# Patient Record
Sex: Female | Born: 1935 | Race: White | Hispanic: No | Smoking: Never smoker
Health system: Southern US, Community
[De-identification: ages and names within clinical notes are randomized; demographics above are authoritative.]

## PROBLEM LIST (undated history)

## (undated) DIAGNOSIS — T7840XA Allergy, unspecified, initial encounter: Secondary | ICD-10-CM

## (undated) DIAGNOSIS — I10 Essential (primary) hypertension: Secondary | ICD-10-CM

## (undated) DIAGNOSIS — E785 Hyperlipidemia, unspecified: Secondary | ICD-10-CM

## (undated) DIAGNOSIS — S62101A Fracture of unspecified carpal bone, right wrist, initial encounter for closed fracture: Secondary | ICD-10-CM

## (undated) DIAGNOSIS — F329 Major depressive disorder, single episode, unspecified: Secondary | ICD-10-CM

## (undated) DIAGNOSIS — I4891 Unspecified atrial fibrillation: Secondary | ICD-10-CM

## (undated) DIAGNOSIS — C801 Malignant (primary) neoplasm, unspecified: Secondary | ICD-10-CM

## (undated) DIAGNOSIS — K5792 Diverticulitis of intestine, part unspecified, without perforation or abscess without bleeding: Secondary | ICD-10-CM

## (undated) DIAGNOSIS — F32A Depression, unspecified: Secondary | ICD-10-CM

## (undated) DIAGNOSIS — N879 Dysplasia of cervix uteri, unspecified: Secondary | ICD-10-CM

## (undated) DIAGNOSIS — I872 Venous insufficiency (chronic) (peripheral): Secondary | ICD-10-CM

## (undated) HISTORY — DX: Essential (primary) hypertension: I10

## (undated) HISTORY — DX: Major depressive disorder, single episode, unspecified: F32.9

## (undated) HISTORY — DX: Malignant (primary) neoplasm, unspecified: C80.1

## (undated) HISTORY — DX: Hyperlipidemia, unspecified: E78.5

## (undated) HISTORY — PX: OTHER SURGICAL HISTORY: SHX169

## (undated) HISTORY — DX: Fracture of unspecified carpal bone, right wrist, initial encounter for closed fracture: S62.101A

## (undated) HISTORY — DX: Allergy, unspecified, initial encounter: T78.40XA

## (undated) HISTORY — DX: Dysplasia of cervix uteri, unspecified: N87.9

## (undated) HISTORY — DX: Unspecified atrial fibrillation: I48.91

## (undated) HISTORY — DX: Venous insufficiency (chronic) (peripheral): I87.2

## (undated) HISTORY — DX: Diverticulitis of intestine, part unspecified, without perforation or abscess without bleeding: K57.92

## (undated) HISTORY — DX: Depression, unspecified: F32.A

---

## 1998-08-24 LAB — HM COLONOSCOPY: HM Colonoscopy: NORMAL

## 2007-06-17 ENCOUNTER — Emergency Department: Payer: Self-pay | Admitting: Emergency Medicine

## 2007-06-18 ENCOUNTER — Ambulatory Visit: Payer: Self-pay | Admitting: General Practice

## 2008-01-03 ENCOUNTER — Ambulatory Visit: Payer: Self-pay | Admitting: Internal Medicine

## 2008-05-01 ENCOUNTER — Ambulatory Visit: Payer: Self-pay | Admitting: Internal Medicine

## 2008-06-26 ENCOUNTER — Ambulatory Visit: Payer: Self-pay | Admitting: Gastroenterology

## 2008-07-09 ENCOUNTER — Ambulatory Visit: Payer: Self-pay | Admitting: Surgery

## 2008-07-24 ENCOUNTER — Ambulatory Visit: Payer: Self-pay | Admitting: Surgery

## 2008-07-24 HISTORY — PX: COLON SURGERY: SHX602

## 2008-07-31 ENCOUNTER — Inpatient Hospital Stay: Payer: Self-pay | Admitting: Surgery

## 2008-08-24 ENCOUNTER — Ambulatory Visit: Payer: Self-pay | Admitting: Oncology

## 2008-08-31 ENCOUNTER — Ambulatory Visit: Payer: Self-pay | Admitting: Oncology

## 2008-09-24 ENCOUNTER — Ambulatory Visit: Payer: Self-pay | Admitting: Oncology

## 2008-11-27 ENCOUNTER — Emergency Department: Payer: Self-pay | Admitting: Emergency Medicine

## 2008-12-22 ENCOUNTER — Ambulatory Visit: Payer: Self-pay | Admitting: Oncology

## 2008-12-27 ENCOUNTER — Ambulatory Visit: Payer: Self-pay | Admitting: Oncology

## 2009-01-10 ENCOUNTER — Ambulatory Visit: Payer: Self-pay | Admitting: Internal Medicine

## 2009-01-22 ENCOUNTER — Ambulatory Visit: Payer: Self-pay | Admitting: Oncology

## 2009-05-17 ENCOUNTER — Other Ambulatory Visit: Payer: Self-pay | Admitting: Internal Medicine

## 2009-05-24 ENCOUNTER — Ambulatory Visit: Payer: Self-pay | Admitting: Oncology

## 2009-06-13 ENCOUNTER — Ambulatory Visit: Payer: Self-pay | Admitting: Oncology

## 2009-06-24 ENCOUNTER — Ambulatory Visit: Payer: Self-pay | Admitting: Oncology

## 2009-07-08 ENCOUNTER — Ambulatory Visit: Payer: Self-pay | Admitting: Gastroenterology

## 2009-08-24 DIAGNOSIS — C801 Malignant (primary) neoplasm, unspecified: Secondary | ICD-10-CM

## 2009-08-24 HISTORY — DX: Malignant (primary) neoplasm, unspecified: C80.1

## 2009-11-19 ENCOUNTER — Ambulatory Visit: Payer: Self-pay | Admitting: Internal Medicine

## 2009-12-22 ENCOUNTER — Ambulatory Visit: Payer: Self-pay | Admitting: Oncology

## 2009-12-23 ENCOUNTER — Ambulatory Visit: Payer: Self-pay | Admitting: Oncology

## 2010-01-22 ENCOUNTER — Ambulatory Visit: Payer: Self-pay | Admitting: Oncology

## 2010-05-13 ENCOUNTER — Ambulatory Visit: Payer: Self-pay | Admitting: Internal Medicine

## 2010-06-23 ENCOUNTER — Ambulatory Visit: Payer: Self-pay | Admitting: Oncology

## 2010-06-24 ENCOUNTER — Ambulatory Visit: Payer: Self-pay | Admitting: Oncology

## 2010-07-15 ENCOUNTER — Ambulatory Visit: Payer: Self-pay | Admitting: Gastroenterology

## 2010-07-24 ENCOUNTER — Ambulatory Visit: Payer: Self-pay | Admitting: Oncology

## 2010-10-17 ENCOUNTER — Other Ambulatory Visit: Payer: Self-pay | Admitting: Orthopedic Surgery

## 2010-10-17 DIAGNOSIS — M545 Low back pain, unspecified: Secondary | ICD-10-CM

## 2010-10-22 ENCOUNTER — Ambulatory Visit
Admission: RE | Admit: 2010-10-22 | Discharge: 2010-10-22 | Disposition: A | Discharge status: Home / Self Care | Payer: Medicare HMO | Source: Ambulatory Visit | Attending: Orthopedic Surgery | Admitting: Orthopedic Surgery

## 2010-10-22 DIAGNOSIS — M545 Low back pain, unspecified: Secondary | ICD-10-CM

## 2010-11-13 ENCOUNTER — Ambulatory Visit: Payer: Self-pay | Admitting: Pain Medicine

## 2010-11-24 ENCOUNTER — Ambulatory Visit: Payer: Self-pay | Admitting: Pain Medicine

## 2010-12-02 ENCOUNTER — Ambulatory Visit: Payer: Self-pay | Admitting: Pain Medicine

## 2010-12-15 ENCOUNTER — Ambulatory Visit: Payer: Self-pay | Admitting: Oncology

## 2010-12-16 ENCOUNTER — Ambulatory Visit: Payer: Self-pay | Admitting: Pain Medicine

## 2010-12-16 LAB — CEA: CEA: 4.8 ng/mL — ABNORMAL HIGH (ref 0.0–4.7)

## 2010-12-23 ENCOUNTER — Ambulatory Visit: Payer: Self-pay | Admitting: Oncology

## 2011-04-15 ENCOUNTER — Ambulatory Visit: Payer: Self-pay | Admitting: Internal Medicine

## 2011-04-23 ENCOUNTER — Telehealth: Payer: Self-pay | Admitting: Internal Medicine

## 2011-04-23 DIAGNOSIS — Z1239 Encounter for other screening for malignant neoplasm of breast: Secondary | ICD-10-CM

## 2011-04-24 NOTE — Telephone Encounter (Signed)
Patient called and requested her yearly mammogram be set up.  She stated her last one was last Sept. And she wants to go to [place].  Please advise

## 2011-06-03 LAB — HM MAMMOGRAPHY: HM Mammogram: NORMAL

## 2011-06-05 NOTE — Telephone Encounter (Signed)
Patient has an appt at [facility] for her mammogram on 06/12/11 patient was informed by Zella Ball on 9.4.12 per notes on order/slj

## 2011-06-12 ENCOUNTER — Ambulatory Visit: Payer: Self-pay | Admitting: Internal Medicine

## 2011-06-12 ENCOUNTER — Encounter: Payer: Self-pay | Admitting: Internal Medicine

## 2011-06-12 ENCOUNTER — Ambulatory Visit (INDEPENDENT_AMBULATORY_CARE_PROVIDER_SITE_OTHER): Payer: Medicare HMO | Admitting: Internal Medicine

## 2011-06-12 DIAGNOSIS — J45909 Unspecified asthma, uncomplicated: Secondary | ICD-10-CM

## 2011-06-12 DIAGNOSIS — J309 Allergic rhinitis, unspecified: Secondary | ICD-10-CM

## 2011-06-12 DIAGNOSIS — E785 Hyperlipidemia, unspecified: Secondary | ICD-10-CM

## 2011-06-12 DIAGNOSIS — I4891 Unspecified atrial fibrillation: Secondary | ICD-10-CM

## 2011-06-12 DIAGNOSIS — M810 Age-related osteoporosis without current pathological fracture: Secondary | ICD-10-CM

## 2011-06-12 DIAGNOSIS — I10 Essential (primary) hypertension: Secondary | ICD-10-CM

## 2011-06-12 MED ORDER — ALENDRONATE SODIUM 40 MG PO TABS: 40.0000 mg | ORAL_TABLET | ORAL | Status: DC

## 2011-06-12 MED ORDER — ALBUTEROL 90 MCG/ACT IN AERS: 2.0000 | INHALATION_SPRAY | Freq: Four times a day (QID) | RESPIRATORY_TRACT | Status: DC | PRN

## 2011-06-12 MED ORDER — LISINOPRIL 5 MG PO TABS: 5.0000 mg | ORAL_TABLET | Freq: Every day | ORAL | Status: DC

## 2011-06-12 MED ORDER — TIOTROPIUM BROMIDE MONOHYDRATE 18 MCG IN CAPS: 18.0000 ug | ORAL_CAPSULE | Freq: Every day | RESPIRATORY_TRACT | Status: AC

## 2011-06-12 NOTE — Progress Notes (Signed)
  Subjective:    Patient ID: Diamond Walsh, female    DOB: 29-Aug-1934, 76 y.o.   MRN: [id number]  HPI Increased aniety about son who is an alcoholic.  Second issue is recurrent left lower quadrant gnawing pain which is accompanied by nausea, lasts a few days , then resolves  spontaaneously.  No change in bowel or bladder habits during epsiodes,  No known fevers.  Last colonoscopy fall 2011. Tics noted   Review of Systems  Constitutional: Negative for fever, chills and unexpected weight change.  HENT: Negative for hearing loss, ear pain, nosebleeds, congestion, sore throat, facial swelling, rhinorrhea, sneezing, mouth sores, trouble swallowing, neck pain, neck stiffness, voice change, postnasal drip, sinus pressure, tinnitus and ear discharge.   Eyes: Negative for pain, discharge, redness and visual disturbance.  Respiratory: Negative for cough, chest tightness, shortness of breath, wheezing and stridor.   Cardiovascular: Negative for chest pain, palpitations and leg swelling.  Musculoskeletal: Negative for myalgias and arthralgias.  Skin: Negative for color change and rash.  Neurological: Negative for dizziness, weakness, light-headedness and headaches.  Hematological: Negative for adenopathy.   BP 138/72  Pulse 72  Temp(Src) 98.7 F (37.1 C) (Oral)  Ht 5\' 6"  (1.676 m)  Wt 168 lb (76.204 kg)  BMI 27.12 kg/m2     Objective:   Physical Exam  Constitutional: She is oriented to person, place, and time. She appears well-developed and well-nourished.  HENT:  Mouth/Throat: Oropharynx is clear and moist.  Eyes: EOM are normal. Pupils are equal, round, and reactive to light. No scleral icterus.  Neck: Normal range of motion. Neck supple. No JVD present. No thyromegaly present.  Cardiovascular: Normal rate, regular rhythm, normal heart sounds and intact distal pulses.   Pulmonary/Chest: Effort normal and breath sounds normal.  Abdominal: Soft. Bowel sounds are normal. She exhibits no mass.  There is no tenderness.  Musculoskeletal: Normal range of motion. She exhibits no edema.  Lymphadenopathy:    She has no cervical adenopathy.  Neurological: She is alert and oriented to person, place, and time.  Skin: Skin is warm and dry.  Psychiatric: She has a normal mood and affect.          Assessment & Plan:  Anxiety:  Spent 10 minutes discussing sources, including her son's trouble with alcholism.  Symptoms are currently not affecting her sleep or appetite.    s

## 2011-06-12 NOTE — Patient Instructions (Signed)
Return for fasting labs  at your leisure  We are starting 5 mg of lisinopril for goal bp of < 130/80

## 2011-06-14 ENCOUNTER — Encounter: Payer: Self-pay | Admitting: Internal Medicine

## 2011-06-14 DIAGNOSIS — F32A Depression, unspecified: Secondary | ICD-10-CM | POA: Insufficient documentation

## 2011-06-14 DIAGNOSIS — K5792 Diverticulitis of intestine, part unspecified, without perforation or abscess without bleeding: Secondary | ICD-10-CM | POA: Insufficient documentation

## 2011-06-14 DIAGNOSIS — I471 Supraventricular tachycardia, unspecified: Secondary | ICD-10-CM | POA: Insufficient documentation

## 2011-06-14 DIAGNOSIS — I10 Essential (primary) hypertension: Secondary | ICD-10-CM | POA: Insufficient documentation

## 2011-06-14 DIAGNOSIS — F329 Major depressive disorder, single episode, unspecified: Secondary | ICD-10-CM | POA: Insufficient documentation

## 2011-06-14 DIAGNOSIS — T7840XA Allergy, unspecified, initial encounter: Secondary | ICD-10-CM | POA: Insufficient documentation

## 2011-06-14 DIAGNOSIS — E785 Hyperlipidemia, unspecified: Secondary | ICD-10-CM | POA: Insufficient documentation

## 2011-06-14 NOTE — Assessment & Plan Note (Signed)
mild elevation noted today, confirmed with home readings..  Adding lisinopril

## 2011-06-14 NOTE — Assessment & Plan Note (Signed)
Managed with pravastatin . Fasting lipids due.   

## 2011-06-14 NOTE — Assessment & Plan Note (Signed)
Controlled with diltiazem.  Not taking coumadin for long term anticoagulation, which may merit revisiting in the near future given her age and concurrent hypertension.

## 2011-06-16 ENCOUNTER — Telehealth: Payer: Self-pay | Admitting: *Deleted

## 2011-06-16 DIAGNOSIS — R5381 Other malaise: Secondary | ICD-10-CM

## 2011-06-16 DIAGNOSIS — Z79899 Other long term (current) drug therapy: Secondary | ICD-10-CM

## 2011-06-16 DIAGNOSIS — E785 Hyperlipidemia, unspecified: Secondary | ICD-10-CM

## 2011-06-16 NOTE — Telephone Encounter (Signed)
Patient is coming in for labs tomorrow. I see in office visit that she was told to return for fasting labs, but I do not see an order. What labs would you like for be to draw.

## 2011-06-17 ENCOUNTER — Other Ambulatory Visit (INDEPENDENT_AMBULATORY_CARE_PROVIDER_SITE_OTHER): Payer: Medicare HMO | Admitting: *Deleted

## 2011-06-17 DIAGNOSIS — E785 Hyperlipidemia, unspecified: Secondary | ICD-10-CM

## 2011-06-17 DIAGNOSIS — R5381 Other malaise: Secondary | ICD-10-CM

## 2011-06-17 LAB — CBC WITH DIFFERENTIAL/PLATELET
Basophils Relative: 0.5 % (ref 0.0–3.0)
Eosinophils Relative: 3.4 % (ref 0.0–5.0)
HCT: 41.2 % (ref 36.0–46.0)
Hemoglobin: 13.7 g/dL (ref 12.0–15.0)
Lymphs Abs: 1.7 10*3/uL (ref 0.7–4.0)
MCV: 84.9 fl (ref 78.0–100.0)
Monocytes Absolute: 0.6 10*3/uL (ref 0.1–1.0)
Monocytes Relative: 11.7 % (ref 3.0–12.0)
RBC: 4.86 Mil/uL (ref 3.87–5.11)
WBC: 4.7 10*3/uL (ref 4.5–10.5)

## 2011-06-17 LAB — COMPREHENSIVE METABOLIC PANEL
Alkaline Phosphatase: 74 U/L (ref 39–117)
BUN: 17 mg/dL (ref 6–23)
Creatinine, Ser: 0.6 mg/dL (ref 0.4–1.2)
Glucose, Bld: 90 mg/dL (ref 70–99)
Sodium: 141 mEq/L (ref 135–145)
Total Bilirubin: 0.5 mg/dL (ref 0.3–1.2)

## 2011-06-17 LAB — LIPID PANEL
Cholesterol: 167 mg/dL (ref 0–200)
LDL Cholesterol: 78 mg/dL (ref 0–99)

## 2011-06-17 NOTE — Telephone Encounter (Signed)
entered in epic,  thanks

## 2011-06-18 ENCOUNTER — Ambulatory Visit: Payer: Self-pay | Admitting: Oncology

## 2011-06-19 ENCOUNTER — Encounter: Payer: Self-pay | Admitting: Internal Medicine

## 2011-06-19 LAB — CEA: CEA: 2.9 ng/mL (ref 0.0–4.7)

## 2011-06-22 ENCOUNTER — Telehealth: Payer: Self-pay | Admitting: Internal Medicine

## 2011-06-22 NOTE — Telephone Encounter (Signed)
[phone] Pt would like get lab results she had done last week

## 2011-06-22 NOTE — Telephone Encounter (Signed)
Notified patient I sent her a letter in the mail last week notifying her of her lab results.

## 2011-06-25 ENCOUNTER — Ambulatory Visit: Payer: Self-pay | Admitting: Oncology

## 2011-07-01 ENCOUNTER — Encounter: Payer: Self-pay | Admitting: Internal Medicine

## 2011-07-27 ENCOUNTER — Telehealth: Payer: Self-pay | Admitting: Internal Medicine

## 2011-07-27 DIAGNOSIS — R1031 Right lower quadrant pain: Secondary | ICD-10-CM

## 2011-07-27 NOTE — Telephone Encounter (Signed)
Patient notified

## 2011-07-27 NOTE — Telephone Encounter (Signed)
Patient called and stated she was told to call and let you know if she had another episode of nausea and discomfort and a CT would be scheduled she wanted to know if this could be done now.  Please advise.

## 2011-07-27 NOTE — Telephone Encounter (Signed)
Yes I will order the CT of abdome and pelvis and have placed the order EPIC for [facility] to do.

## 2011-07-30 ENCOUNTER — Telehealth: Payer: Self-pay | Admitting: Internal Medicine

## 2011-07-30 NOTE — Telephone Encounter (Signed)
Dr. Darrick Huntsman left a message on Diamond Walsh's home line per patient's request.

## 2011-07-30 NOTE — Telephone Encounter (Signed)
Patient called in requesting status on her referral.  I explained to patient that I was working on referrals and that Erie Noe was currently working on her referral yesterday.  I don't know the status of that referral so I advised patient that I would call and see about getting this pre-auth from her insurance. I called her insurance company and for cpt [id number] didn't require a pre-auth.  I called and spoke with Annabelle Harman at [facility] and she gave me an appt. For tomorrow afternoon at [facility] location patient needs to be NPO after 9 in the morning, and she needs to go and pick up a prep kit.  However when I spoke with Annabelle Harman she said that this really needs to be pelvic/abdominal.  I have called the insurance company again to find out about pre-auth.  She was very upset that it has taken me 5 days to get this done, I seen a  Phone note that was taken on the 3rd at 5:33, I tried to let the patient know that I was sorry it was taking a couple of days to get this scheduled. She would like to talk with Dr. Darrick Huntsman. Zella Ball has made sure the patient is aware of the appointment since I was still holding for insurance company. No pre-cert is required for CPT 74177.

## 2011-07-31 ENCOUNTER — Ambulatory Visit: Payer: Self-pay | Admitting: Internal Medicine

## 2011-08-03 ENCOUNTER — Encounter: Payer: Self-pay | Admitting: Internal Medicine

## 2011-08-03 ENCOUNTER — Telehealth: Payer: Self-pay | Admitting: Internal Medicine

## 2011-08-03 ENCOUNTER — Ambulatory Visit (INDEPENDENT_AMBULATORY_CARE_PROVIDER_SITE_OTHER): Payer: Medicare HMO | Admitting: Internal Medicine

## 2011-08-03 VITALS — BP 136/72 | HR 82 | Temp 98.3°F | Resp 14 | Ht 66.0 in | Wt 167.0 lb

## 2011-08-03 DIAGNOSIS — R1012 Left upper quadrant pain: Secondary | ICD-10-CM

## 2011-08-03 DIAGNOSIS — R110 Nausea: Secondary | ICD-10-CM

## 2011-08-03 LAB — POCT URINALYSIS DIPSTICK
Bilirubin, UA: NEGATIVE
Nitrite, UA: NEGATIVE
Protein, UA: NEGATIVE
Urobilinogen, UA: 0.2
pH, UA: 6

## 2011-08-03 MED ORDER — CIPROFLOXACIN HCL 250 MG PO TABS: 250.0000 mg | ORAL_TABLET | Freq: Two times a day (BID) | ORAL | Status: AC

## 2011-08-03 NOTE — Progress Notes (Signed)
  Subjective:    Patient ID: Diamond Walsh, female    DOB: Nov 01, 1934, 76 y.o.   MRN: [id number]  HPI Ms. Fuhs is a 76 yr old white female with a history of colon cancer, s/p resection and anastomosis who presents with recurrence of nausea and left sided pain for the past week.  She was seen in October and reported an earlier episode which had resolved.  The description was concerning for diverticulosis, and when the pain recurred this week she was evaluated with a CT of the abd and pelvis     Review of Systems  Constitutional: Negative for fever, chills and unexpected weight change.  HENT: Negative for hearing loss, ear pain, nosebleeds, congestion, sore throat, facial swelling, rhinorrhea, sneezing, mouth sores, trouble swallowing, neck pain, neck stiffness, voice change, postnasal drip, sinus pressure, tinnitus and ear discharge.   Eyes: Negative for pain, discharge, redness and visual disturbance.  Respiratory: Negative for cough, chest tightness, shortness of breath, wheezing and stridor.   Cardiovascular: Negative for chest pain, palpitations and leg swelling.  Gastrointestinal: Positive for nausea and abdominal pain. Negative for diarrhea, constipation and blood in stool.  Musculoskeletal: Positive for back pain. Negative for myalgias and arthralgias.  Skin: Negative for color change and rash.  Neurological: Negative for dizziness, weakness, light-headedness and headaches.  Hematological: Negative for adenopathy.       Objective:   Physical Exam  Constitutional: She is oriented to person, place, and time. She appears well-developed and well-nourished.  HENT:  Mouth/Throat: Oropharynx is clear and moist.  Eyes: EOM are normal. Pupils are equal, round, and reactive to light. No scleral icterus.  Neck: Normal range of motion. Neck supple. No JVD present. No thyromegaly present.  Cardiovascular: Normal rate, regular rhythm, normal heart sounds and intact distal pulses.     Pulmonary/Chest: Effort normal and breath sounds normal.  Abdominal: Soft. Bowel sounds are normal. She exhibits no mass. There is no tenderness. There is no rebound and no guarding.  Musculoskeletal: Normal range of motion. She exhibits no edema.  Lymphadenopathy:    She has no cervical adenopathy.  Neurological: She is alert and oriented to person, place, and time.  Skin: Skin is warm and dry.  Psychiatric: She has a normal mood and affect.          Assessment & Plan:

## 2011-08-03 NOTE — Telephone Encounter (Signed)
Patient notified of results.  She stated she is still sick on her stomach and doesn't know what else to do about it.  Please advise.

## 2011-08-03 NOTE — Telephone Encounter (Signed)
Patient has been scheduled for this afternoon, patient notified.

## 2011-08-03 NOTE — Telephone Encounter (Signed)
Message copied by Edd Fabian on Mon Aug 03, 2011  8:53 AM ------      Message from: Duncan Dull      Created: Sun Aug 02, 2011  3:05 PM      Regarding: CT Scan of abd pelvis       The CT scan that Diamond Walsh had on Friday was normal.  There were no signs of inflammation or blockage.

## 2011-08-03 NOTE — Telephone Encounter (Signed)
Please add her on to the end of the day

## 2011-08-04 DIAGNOSIS — R1012 Left upper quadrant pain: Secondary | ICD-10-CM | POA: Insufficient documentation

## 2011-08-04 NOTE — Assessment & Plan Note (Signed)
CT was negative for diverticulitis but positive for bilateral nephrolithiasis.  UA was positive for luekocytes so will treat empirically for uncomplicated pyelonephritis secondary to calculous.

## 2011-08-04 NOTE — Patient Instructions (Signed)
Your pain may be coming from an infection in your kidney caused by the kidney stone that was seen on the Ct.  I am treating your with an antibiotic for one week.

## 2011-08-05 LAB — CULTURE, URINE COMPREHENSIVE
Colony Count: NO GROWTH
Organism ID, Bacteria: NO GROWTH

## 2011-09-07 ENCOUNTER — Encounter: Payer: Self-pay | Admitting: Internal Medicine

## 2011-10-28 ENCOUNTER — Encounter: Payer: Self-pay | Admitting: Internal Medicine

## 2011-11-01 ENCOUNTER — Emergency Department: Payer: Self-pay | Admitting: Emergency Medicine

## 2011-11-01 LAB — BASIC METABOLIC PANEL
Calcium, Total: 9.5 mg/dL (ref 8.5–10.1)
EGFR (African American): >60
EGFR (Non-African Amer.): >60
Glucose: 99 mg/dL (ref 65–99)
Osmolality: 282 (ref 275–301)
Sodium: 141 mmol/L (ref 136–145)

## 2011-11-01 LAB — CBC
HCT: 41.6 % (ref 35.0–47.0)
HGB: 13.7 g/dL (ref 12.0–16.0)
MCH: 28 pg (ref 26.0–34.0)
MCHC: 32.9 g/dL (ref 32.0–36.0)
Platelet: 305 10*3/uL (ref 150–440)
RBC: 4.9 10*6/uL (ref 3.80–5.20)
WBC: 6.1 10*3/uL (ref 3.6–11.0)

## 2011-11-01 LAB — TROPONIN I: Troponin-I: <0.02 ng/mL

## 2011-11-26 ENCOUNTER — Encounter: Payer: Self-pay | Admitting: Internal Medicine

## 2011-12-02 ENCOUNTER — Ambulatory Visit (INDEPENDENT_AMBULATORY_CARE_PROVIDER_SITE_OTHER): Payer: Medicare HMO | Admitting: Internal Medicine

## 2011-12-02 ENCOUNTER — Encounter: Payer: Self-pay | Admitting: Internal Medicine

## 2011-12-02 VITALS — BP 134/62 | HR 98 | Temp 97.9°F | Resp 16 | Ht 66.0 in | Wt 166.5 lb

## 2011-12-02 DIAGNOSIS — Z0000 Encounter for general adult medical examination without abnormal findings: Secondary | ICD-10-CM

## 2011-12-02 DIAGNOSIS — I10 Essential (primary) hypertension: Secondary | ICD-10-CM

## 2011-12-02 DIAGNOSIS — E785 Hyperlipidemia, unspecified: Secondary | ICD-10-CM

## 2011-12-02 DIAGNOSIS — I872 Venous insufficiency (chronic) (peripheral): Secondary | ICD-10-CM

## 2011-12-02 MED ORDER — CITALOPRAM HYDROBROMIDE 20 MG PO TABS: 20.0000 mg | ORAL_TABLET | Freq: Every day | ORAL | Status: AC

## 2011-12-02 MED ORDER — CIPROFLOXACIN HCL 250 MG PO TABS: 250.0000 mg | ORAL_TABLET | Freq: Two times a day (BID) | ORAL | Status: AC

## 2011-12-02 MED ORDER — DILTIAZEM HCL ER COATED BEADS 120 MG PO CP24: 120.0000 mg | ORAL_CAPSULE | Freq: Every day | ORAL | Status: DC

## 2011-12-02 NOTE — Progress Notes (Signed)
Patient ID: Diamond Walsh, female   DOB: 08/09/1935, 77 y.o.   MRN: [id number]   The patient is here for annual Medicare wellness examination and management of other chronic and acute problems.  She had a recent emergency room visit for uncontrolled atrial fibrillation. Her dose of Cardizem was actually reduced to 120 mg daily and her dose of Celexa was reduced to 20 mg daily. She followed up with Dr. call scan on April 9 at which time no changes in her diltiazem dose was at work made. She had a recent echocardiogram which showed normal LV systolic function with an ejection fraction of 50%, biatrial enlargement, and moderate mitral and tricuspid regurgitation some of which is age-related.    The risk factors are reflected in the social history.  The roster of all physicians providing medical care to patient - is listed in the Snapshot section of the chart.  Activities of daily living:  The patient is 100% independent in all ADLs: dressing, toileting, feeding as well as independent mobility  Home safety : The patient has smoke detectors in the home. They wear seatbelts.  There are no firearms at home. There is no violence in the home.   There is no risks for hepatitis, STDs or HIV. There is no   history of blood transfusion. They have no travel history to infectious disease endemic areas of the world.  The patient has seen their dentist in the last six month. They have seen their eye doctor in the last year. They admit to slight hearing difficulty with regard to whispered voices and some television programs.  They have deferred audiologic testing in the last year.  They do not  have excessive sun exposure. Discussed the need for sun protection: hats, long sleeves and use of sunscreen if there is significant sun exposure.   Diet: the importance of a healthy diet is discussed. They do have a healthy diet.  The benefits of regular aerobic exercise were discussed. She walks 4 times per week ,  20  minutes.   Depression screen: there are no signs or vegative symptoms of depression- irritability, change in appetite, anhedonia, sadness/tearfullness.  Cognitive assessment: the patient manages all their financial and personal affairs and is actively engaged. They could relate day,date,year and events; recalled 2/3 objects at 3 minutes; performed clock-face test normally.  The following portions of the patient's history were reviewed and updated as appropriate: allergies, current medications, past family history, past medical history,  past surgical history, past social history  and problem list.  Visual acuity was not assessed per patient preference since she has regular follow up with her ophthalmologist. Hearing and body mass index were assessed and reviewed.   During the course of the visit the patient was educated and counseled about appropriate screening and preventive services including : fall prevention , diabetes screening, nutrition counseling, colorectal cancer screening, and recommended immunizations.   Assessment and Plan  Hypertension Well-controlled on current regimen. She is due for followup blood work to monitor kidney function and electronic.  Venous insufficiency (chronic) (peripheral) Management exercise leg elevation and stockings. She has had no trouble with worsening edema secondary to Cardizem use.  Hyperlipidemia Well-controlled by last check with elevated HDL and LDL less than 100.    Updated Medication List Outpatient Encounter Prescriptions as of 12/02/2011  Medication Sig Dispense Refill  . albuterol (PROVENTIL,VENTOLIN) 90 MCG/ACT inhaler Inhale 2 puffs into the lungs every 6 (six) hours as needed.  3 Act  3  .  aspirin 81 MG tablet Take 81 mg by mouth daily.        . citalopram (CELEXA) 20 MG tablet Take 1 tablet (20 mg total) by mouth daily.  90 tablet  3  . diltiazem (CARDIZEM CD) 120 MG 24 hr capsule Take 1 capsule (120 mg total) by mouth daily.  90  capsule  3  . lisinopril (PRINIVIL,ZESTRIL) 5 MG tablet Take 20 mg by mouth daily.      . metaproterenol (ALUPENT) 10 MG tablet Take 10 mg by mouth as needed.      . Multiple Vitamins-Minerals (PRESERVISION AREDS 2 PO) Take by mouth.        . pravastatin (PRAVACHOL) 40 MG tablet Take 40 mg by mouth daily.      Marland Kitchen tiotropium (SPIRIVA) 18 MCG inhalation capsule Place 1 capsule (18 mcg total) into inhaler and inhale daily.  90 capsule  3  . DISCONTD: citalopram (CELEXA) 20 MG tablet Take 20 mg by mouth daily.        Marland Kitchen DISCONTD: diltiazem (CARDIZEM CD) 180 MG 24 hr capsule Take 120 mg by mouth daily.       Marland Kitchen DISCONTD: lisinopril (PRINIVIL,ZESTRIL) 5 MG tablet Take 1 tablet (5 mg total) by mouth daily.  90 tablet  3  . ciprofloxacin (CIPRO) 250 MG tablet Take 1 tablet (250 mg total) by mouth 2 (two) times daily.  14 tablet  0  . DISCONTD: alendronate (FOSAMAX) 40 MG tablet Take 1 tablet (40 mg total) by mouth once a week. Take with a full glass of water on an empty stomach.  Pt unclear about mg dosage  12 tablet  3

## 2011-12-03 ENCOUNTER — Encounter: Payer: Self-pay | Admitting: Internal Medicine

## 2011-12-03 DIAGNOSIS — I872 Venous insufficiency (chronic) (peripheral): Secondary | ICD-10-CM | POA: Insufficient documentation

## 2011-12-03 LAB — COMPLETE METABOLIC PANEL WITH GFR
ALT: 16 U/L (ref 0–35)
Albumin: 4.4 g/dL (ref 3.5–5.2)
CO2: 23 mEq/L (ref 19–32)
Chloride: 102 mEq/L (ref 96–112)
GFR, Est African American: >89 mL/min
GFR, Est Non African American: >89 mL/min
Glucose, Bld: 132 mg/dL — ABNORMAL HIGH (ref 70–99)
Potassium: 4.6 mEq/L (ref 3.5–5.3)
Sodium: 135 mEq/L (ref 135–145)
Total Protein: 7.1 g/dL (ref 6.0–8.3)

## 2011-12-03 NOTE — Assessment & Plan Note (Signed)
Management exercise leg elevation and stockings. She has had no trouble with worsening edema secondary to Cardizem use.

## 2011-12-03 NOTE — Assessment & Plan Note (Signed)
Well-controlled by last check with elevated HDL and LDL less than 100.

## 2011-12-03 NOTE — Assessment & Plan Note (Signed)
Well-controlled on current regimen. She is due for followup blood work to monitor kidney function and electronic.

## 2011-12-10 ENCOUNTER — Ambulatory Visit: Payer: Self-pay | Admitting: Oncology

## 2011-12-10 LAB — COMPREHENSIVE METABOLIC PANEL
Alkaline Phosphatase: 105 U/L (ref 50–136)
Anion Gap: 8 (ref 7–16)
BUN: 19 mg/dL — ABNORMAL HIGH (ref 7–18)
Bilirubin,Total: 0.3 mg/dL (ref 0.2–1.0)
Calcium, Total: 9 mg/dL (ref 8.5–10.1)
Co2: 30 mmol/L (ref 21–32)
Glucose: 109 mg/dL — ABNORMAL HIGH (ref 65–99)
Osmolality: 282 (ref 275–301)
SGOT(AST): 17 U/L (ref 15–37)
SGPT (ALT): 23 U/L
Total Protein: 7.4 g/dL (ref 6.4–8.2)

## 2011-12-10 LAB — CBC CANCER CENTER
Basophil %: 0.7 %
Eosinophil #: 0.1 x10 3/mm (ref 0.0–0.7)
Eosinophil %: 2.8 %
HGB: 13 g/dL (ref 12.0–16.0)
Lymphocyte #: 1.4 x10 3/mm (ref 1.0–3.6)
MCH: 27.3 pg (ref 26.0–34.0)
MCV: 86 fL (ref 80–100)
Monocyte %: 12.1 %
Neutrophil #: 3 x10 3/mm (ref 1.4–6.5)
RBC: 4.76 10*6/uL (ref 3.80–5.20)
RDW: 14.2 % (ref 11.5–14.5)
WBC: 5.3 x10 3/mm (ref 3.6–11.0)

## 2011-12-16 ENCOUNTER — Telehealth: Payer: Self-pay | Admitting: Internal Medicine

## 2011-12-16 MED ORDER — PRAVASTATIN SODIUM 40 MG PO TABS: 40.0000 mg | ORAL_TABLET | Freq: Every day | ORAL | Status: DC

## 2011-12-16 NOTE — Telephone Encounter (Signed)
On printer

## 2011-12-16 NOTE — Telephone Encounter (Signed)
[phone] Pt needs to get refill on pravastatin 40 mg.  Pt stated she needed written rx Please advise when pt can pick up

## 2011-12-16 NOTE — Telephone Encounter (Signed)
Patient notified, she will pick up Rx. 

## 2011-12-23 ENCOUNTER — Ambulatory Visit: Payer: Self-pay | Admitting: Oncology

## 2012-06-02 ENCOUNTER — Ambulatory Visit (INDEPENDENT_AMBULATORY_CARE_PROVIDER_SITE_OTHER): Payer: Medicare HMO | Admitting: Internal Medicine

## 2012-06-02 ENCOUNTER — Encounter: Payer: Self-pay | Admitting: Internal Medicine

## 2012-06-02 VITALS — BP 130/68 | HR 80 | Temp 98.5°F | Ht 65.5 in | Wt 166.5 lb

## 2012-06-02 DIAGNOSIS — E785 Hyperlipidemia, unspecified: Secondary | ICD-10-CM

## 2012-06-02 DIAGNOSIS — M810 Age-related osteoporosis without current pathological fracture: Secondary | ICD-10-CM

## 2012-06-02 DIAGNOSIS — Z79899 Other long term (current) drug therapy: Secondary | ICD-10-CM

## 2012-06-02 DIAGNOSIS — R110 Nausea: Secondary | ICD-10-CM

## 2012-06-02 DIAGNOSIS — I4891 Unspecified atrial fibrillation: Secondary | ICD-10-CM

## 2012-06-02 DIAGNOSIS — Z1239 Encounter for other screening for malignant neoplasm of breast: Secondary | ICD-10-CM

## 2012-06-02 LAB — COMPREHENSIVE METABOLIC PANEL
ALT: 14 U/L (ref 0–35)
Alkaline Phosphatase: 84 U/L (ref 39–117)
CO2: 27 mEq/L (ref 19–32)
GFR: 105.61 mL/min (ref >60.00)
Sodium: 138 mEq/L (ref 135–145)
Total Bilirubin: 0.6 mg/dL (ref 0.3–1.2)
Total Protein: 7.3 g/dL (ref 6.0–8.3)

## 2012-06-02 LAB — LIPID PANEL
HDL: 58.7 mg/dL (ref >39.00)
Total CHOL/HDL Ratio: 2

## 2012-06-02 MED ORDER — LISINOPRIL 5 MG PO TABS: 20.0000 mg | ORAL_TABLET | Freq: Every day | ORAL | Status: DC

## 2012-06-02 NOTE — Progress Notes (Signed)
Patient ID: Diamond Walsh, female   DOB: 25-Nov-1934, 77 y.o.   MRN: [id number]   Patient Active Problem List  Diagnosis  . Osteoporosis  . Hyperlipidemia  . Hypertension  . Asthma  . Depression  . Diverticulitis  . Allergy  . Atrial fibrillation  . Abdominal pain, left upper quadrant  . Venous insufficiency (chronic) (peripheral)  . Nausea in adult    Subjective:  CC:   Chief Complaint  Patient presents with  . Follow-up    HPI:   Diamond Walsh a 77 y.o. female who presents for 6 month follow up of atrial fibrillation and hypertension..  She feels well, and spent summer in [place].  Gained weight over the summer but has lost 10 lbs so far. She has had had 2 episodes of severe nausea which occurred spontaneously, unrelated to eating.  Her last episode was last week . She denies any use of alleve or advil.Marland Kitchen  No change in stools.    Past Medical History  Diagnosis Date  . Osteoporosis   . Hyperlipidemia   . Hypertension   . Cancer 2011    Colon , Stage III  . Asthma   . Depression   . Diverticulitis   . Allergy   . Atrial fibrillation   . Cervical dysplasia   . Wrist fracture, right   . Venous insufficiency (chronic) (peripheral)     Past Surgical History  Procedure Date  . Wrist fracture repaired   . Colon surgery Dec 2009    transverse colectomy, suspicious lesion    The following portions of the patient's history were reviewed and updated as appropriate: Allergies, current medications, and problem list.   Review of Systems:   A comprehensive ROS was done and positive for nausea.   The rest was negative.    History   Social History  . Marital Status: Married    Spouse Name: N/A    Number of Children: N/A  . Years of Education: N/A   Occupational History  . Not on file.   Social History Main Topics  . Smoking status: Never Smoker   . Smokeless tobacco: Never Used  . Alcohol Use: 1.8 oz/week    3 Glasses of wine per week  . Drug Use: No    . Sexually Active: Not Currently   Other Topics Concern  . Not on file   Social History Narrative  . No narrative on file    Objective:  BP 130/68  Pulse 80  Temp 98.5 F (36.9 C) (Oral)  Ht 5' 5.5" (1.664 m)  Wt 166 lb 8 oz (75.524 kg)  BMI 27.29 kg/m2  SpO2 95%  General appearance: alert, cooperative and appears stated age Ears: normal TM's and external ear canals both ears Throat: lips, mucosa, and tongue normal; teeth and gums normal Neck: no adenopathy, no carotid bruit, supple, symmetrical, trachea midline and thyroid not enlarged, symmetric, no tenderness/mass/nodules Back: symmetric, no curvature. ROM normal. No CVA tenderness. Lungs: clear to auscultation bilaterally Heart: regular rate and rhythm, S1, S2 normal, no murmur, click, rub or gallop Abdomen: soft, non-tender; bowel sounds normal; no masses,  no organomegaly Pulses: 2+ and symmetric Skin: Skin color, texture, turgor normal. No rashes or lesions Lymph nodes: Cervical, supraclavicular, and axillary nodes normal.  Assessment and Plan:  Nausea in adult 2 recent unexplained isolated episodes.  No gastritis symptoms in between.  Etiology unclear, LFTs normal.  Had an abd/pelvic contrasted CT in Dec which was normal except for  bilateral nephrolithiasis.  Will recommend trial of PPI   Atrial fibrillation Rate controlled currently,  No changes.    Hyperlipidemia LDL at goal on current therapy.  No changes today/.   Updated Medication List Outpatient Encounter Prescriptions as of 06/02/2012  Medication Sig Dispense Refill  . albuterol (PROVENTIL,VENTOLIN) 90 MCG/ACT inhaler Inhale 2 puffs into the lungs every 6 (six) hours as needed.  3 Act  3  . aspirin 81 MG tablet Take 81 mg by mouth daily.        . citalopram (CELEXA) 20 MG tablet Take 1 tablet (20 mg total) by mouth daily.  90 tablet  3  . diltiazem (CARDIZEM CD) 120 MG 24 hr capsule Take 1 capsule (120 mg total) by mouth daily.  90 capsule  3  .  lisinopril (PRINIVIL,ZESTRIL) 5 MG tablet Take 4 tablets (20 mg total) by mouth daily.  90 tablet  3  . Multiple Vitamins-Minerals (PRESERVISION AREDS 2 PO) Take by mouth.        . pravastatin (PRAVACHOL) 40 MG tablet Take 1 tablet (40 mg total) by mouth daily.  90 tablet  3  . tiotropium (SPIRIVA) 18 MCG inhalation capsule Place 1 capsule (18 mcg total) into inhaler and inhale daily.  90 capsule  3  . DISCONTD: lisinopril (PRINIVIL,ZESTRIL) 5 MG tablet Take 20 mg by mouth daily.      . metaproterenol (ALUPENT) 10 MG tablet Take 10 mg by mouth as needed.         Orders Placed This Encounter  Procedures  . MM Digital Screening  . DG Bone Density  . Lipid panel  . Comprehensive metabolic panel    No Follow-up on file.

## 2012-06-05 ENCOUNTER — Encounter: Payer: Self-pay | Admitting: Internal Medicine

## 2012-06-05 DIAGNOSIS — R110 Nausea: Secondary | ICD-10-CM | POA: Insufficient documentation

## 2012-06-05 NOTE — Assessment & Plan Note (Signed)
Rate controlled currently,  No changes.

## 2012-06-05 NOTE — Assessment & Plan Note (Signed)
LDL at goal on current therapy.  No changes today/.

## 2012-06-05 NOTE — Assessment & Plan Note (Addendum)
2 recent unexplained isolated episodes.  No gastritis symptoms in between.  Etiology unclear, LFTs normal.  Will recommend trial of PPI

## 2012-07-18 ENCOUNTER — Ambulatory Visit: Payer: Self-pay | Admitting: Internal Medicine

## 2012-07-25 ENCOUNTER — Encounter: Payer: Self-pay | Admitting: Internal Medicine

## 2012-12-12 ENCOUNTER — Ambulatory Visit: Payer: Self-pay | Admitting: Oncology

## 2012-12-12 LAB — COMPREHENSIVE METABOLIC PANEL
Alkaline Phosphatase: 127 U/L (ref 50–136)
BUN: 15 mg/dL (ref 7–18)
Calcium, Total: 9.6 mg/dL (ref 8.5–10.1)
Chloride: 103 mmol/L (ref 98–107)
Creatinine: 0.66 mg/dL (ref 0.60–1.30)
Glucose: 102 mg/dL — ABNORMAL HIGH (ref 65–99)
Potassium: 5.2 mmol/L — ABNORMAL HIGH (ref 3.5–5.1)
SGOT(AST): 42 U/L — ABNORMAL HIGH (ref 15–37)
SGPT (ALT): 59 U/L (ref 12–78)
Total Protein: 7.7 g/dL (ref 6.4–8.2)

## 2012-12-12 LAB — CBC CANCER CENTER
Basophil #: 0.1 x10 3/mm (ref 0.0–0.1)
Basophil %: 1.1 %
Eosinophil #: 0.2 x10 3/mm (ref 0.0–0.7)
HCT: 41.7 % (ref 35.0–47.0)
HGB: 13.5 g/dL (ref 12.0–16.0)
MCV: 84 fL (ref 80–100)
Neutrophil #: 2.7 x10 3/mm (ref 1.4–6.5)
RBC: 4.98 10*6/uL (ref 3.80–5.20)
WBC: 5.3 x10 3/mm (ref 3.6–11.0)

## 2012-12-13 LAB — CEA: CEA: 3.6 ng/mL (ref 0.0–4.7)

## 2012-12-14 ENCOUNTER — Ambulatory Visit (INDEPENDENT_AMBULATORY_CARE_PROVIDER_SITE_OTHER): Payer: Medicare HMO | Admitting: Internal Medicine

## 2012-12-14 ENCOUNTER — Encounter: Payer: Self-pay | Admitting: Internal Medicine

## 2012-12-14 VITALS — BP 136/68 | HR 78 | Temp 98.1°F | Resp 14 | Wt 156.8 lb

## 2012-12-14 DIAGNOSIS — F329 Major depressive disorder, single episode, unspecified: Secondary | ICD-10-CM

## 2012-12-14 DIAGNOSIS — I4891 Unspecified atrial fibrillation: Secondary | ICD-10-CM

## 2012-12-14 DIAGNOSIS — J45909 Unspecified asthma, uncomplicated: Secondary | ICD-10-CM

## 2012-12-14 DIAGNOSIS — E785 Hyperlipidemia, unspecified: Secondary | ICD-10-CM

## 2012-12-14 DIAGNOSIS — I10 Essential (primary) hypertension: Secondary | ICD-10-CM

## 2012-12-14 DIAGNOSIS — Z79899 Other long term (current) drug therapy: Secondary | ICD-10-CM

## 2012-12-14 DIAGNOSIS — R7989 Other specified abnormal findings of blood chemistry: Secondary | ICD-10-CM

## 2012-12-14 DIAGNOSIS — F3289 Other specified depressive episodes: Secondary | ICD-10-CM

## 2012-12-14 DIAGNOSIS — F32A Depression, unspecified: Secondary | ICD-10-CM

## 2012-12-14 MED ORDER — PRAVASTATIN SODIUM 40 MG PO TABS: 40.0000 mg | ORAL_TABLET | Freq: Every day | ORAL | Status: AC

## 2012-12-14 MED ORDER — TRAMADOL HCL 50 MG PO TABS: 50.0000 mg | ORAL_TABLET | Freq: Three times a day (TID) | ORAL | Status: AC | PRN

## 2012-12-14 MED ORDER — DILTIAZEM HCL ER COATED BEADS 120 MG PO CP24: 120.0000 mg | ORAL_CAPSULE | Freq: Every day | ORAL | Status: AC

## 2012-12-14 MED ORDER — LISINOPRIL 5 MG PO TABS: 5.0000 mg | ORAL_TABLET | Freq: Every day | ORAL | Status: AC

## 2012-12-14 MED ORDER — ALBUTEROL SULFATE HFA 108 (90 BASE) MCG/ACT IN AERS: 2.0000 | INHALATION_SPRAY | Freq: Four times a day (QID) | RESPIRATORY_TRACT | Status: AC | PRN

## 2012-12-14 NOTE — Progress Notes (Deleted)
Patient ID: Diamond Walsh, female   DOB: September 04, 1934, 78 y.o.   MRN: [id number]  Patient Active Problem List  Diagnosis  . Osteoporosis  . Hyperlipidemia  . Hypertension  . Asthma  . Depression  . Diverticulitis  . Allergy  . Atrial fibrillation  . Abdominal pain, left upper quadrant  . Venous insufficiency (chronic) (peripheral)  . Other abnormal blood chemistry    Subjective:  CC:   Chief Complaint  Patient presents with  . Follow-up    6 month please look at dosage on lisinopril patient states 5 mg daily.    HPI:   Diamond Walsh a 78 y.o. female who presents  Past Medical History  Diagnosis Date  . Osteoporosis   . Hyperlipidemia   . Hypertension   . Cancer 2011    Colon , Stage III  . Asthma   . Depression   . Diverticulitis   . Allergy   . Atrial fibrillation   . Cervical dysplasia   . Wrist fracture, right   . Venous insufficiency (chronic) (peripheral)     Past Surgical History  Procedure Laterality Date  . Wrist fracture repaired    . Colon surgery  Dec 2009    transverse colectomy, suspicious lesion       The following portions of the patient's history were reviewed and updated as appropriate: Allergies, current medications, and problem list.    Review of Systems:   12 Pt  review of systems was negative except those addressed in the HPI,     History   Social History  . Marital Status: Married    Spouse Name: N/A    Number of Children: N/A  . Years of Education: N/A   Occupational History  . Not on file.   Social History Main Topics  . Smoking status: Never Smoker   . Smokeless tobacco: Never Used  . Alcohol Use: 1.8 oz/week    3 Glasses of wine per week  . Drug Use: No  . Sexually Active: Not Currently   Other Topics Concern  . Not on file   Social History Narrative  . No narrative on file    Objective:  BP 136/68  Pulse 78  Temp(Src) 98.1 F (36.7 C) (Oral)  Resp 14  Wt 156 lb 12 oz (71.101 kg)  BMI 25.68  kg/m2  SpO2 96%  General appearance: alert, cooperative and appears stated age Ears: normal TM's and external ear canals both ears Throat: lips, mucosa, and tongue normal; teeth and gums normal Neck: no adenopathy, no carotid bruit, supple, symmetrical, trachea midline and thyroid not enlarged, symmetric, no tenderness/mass/nodules Back: symmetric, no curvature. ROM normal. No CVA tenderness. Lungs: clear to auscultation bilaterally Heart: regular rate and rhythm, S1, S2 normal, no murmur, click, rub or gallop Abdomen: soft, non-tender; bowel sounds normal; no masses,  no organomegaly Pulses: 2+ and symmetric Skin: Skin color, texture, turgor normal. No rashes or lesions Lymph nodes: Cervical, supraclavicular, and axillary nodes normal.  Assessment and Plan:  Hyperlipidemia Controlled with statin therapy. LDL is at goal today. AST is mildly elevated at 36 and we will repeat this in one month.  Depression , Complicated by prolonged grief reaction from death of sister. Symptoms are currently controlled with Celexa. No changes today. She plans to  consider tapering off of the medication in 3 months.  Atrial fibrillation Rate controlled on diltiazem. She is followed by annually by cardiology. She has no history of diabetes or heart failure but does  have hypertension and has not been anticoagulated  historically due to age. However given her low risk for falls we will need to consider anticoagulation for medication of stroke risk.  Hypertension Well controlled on current regimen. Renal function stable, no changes today.  Other abnormal blood chemistry AST is slightly elevated and she is on statin therapy. We will repeat a hepatic panel in 4 weeks.   Updated Medication List Outpatient Encounter Prescriptions as of 12/14/2012  Medication Sig Dispense Refill  . aspirin 81 MG tablet Take 81 mg by mouth daily.        . citalopram (CELEXA) 20 MG tablet Take 1 tablet (20 mg total) by mouth  daily.  90 tablet  3  . diltiazem (CARDIZEM CD) 120 MG 24 hr capsule Take 1 capsule (120 mg total) by mouth daily.  90 capsule  3  . lisinopril (PRINIVIL,ZESTRIL) 5 MG tablet Take 1 tablet (5 mg total) by mouth daily.  90 tablet  3  . Multiple Vitamins-Minerals (PRESERVISION AREDS 2 PO) Take by mouth.        . pravastatin (PRAVACHOL) 40 MG tablet Take 1 tablet (40 mg total) by mouth daily.  90 tablet  3  . tiotropium (SPIRIVA) 18 MCG inhalation capsule Place 1 capsule (18 mcg total) into inhaler and inhale daily.  90 capsule  3  . [DISCONTINUED] albuterol (PROVENTIL,VENTOLIN) 90 MCG/ACT inhaler Inhale 2 puffs into the lungs every 6 (six) hours as needed.  3 Act  3  . [DISCONTINUED] diltiazem (CARDIZEM CD) 120 MG 24 hr capsule Take 1 capsule (120 mg total) by mouth daily.  90 capsule  3  . [DISCONTINUED] lisinopril (PRINIVIL,ZESTRIL) 5 MG tablet Take 4 tablets (20 mg total) by mouth daily.  90 tablet  3  . [DISCONTINUED] pravastatin (PRAVACHOL) 40 MG tablet Take 1 tablet (40 mg total) by mouth daily.  90 tablet  3  . albuterol (PROVENTIL HFA;VENTOLIN HFA) 108 (90 BASE) MCG/ACT inhaler Inhale 2 puffs into the lungs every 6 (six) hours as needed for wheezing.  6.7 g  1  . traMADol (ULTRAM) 50 MG tablet Take 1 tablet (50 mg total) by mouth every 8 (eight) hours as needed for pain.  90 tablet  3  . [DISCONTINUED] metaproterenol (ALUPENT) 10 MG tablet Take 10 mg by mouth as needed.       No facility-administered encounter medications on file as of 12/14/2012.     Orders Placed This Encounter  Procedures  . [facility] M[facility]MOGRAP[facility]  . Comprehensive metabolic panel  . Lipid panel  . Hepatic function panel    No Follow-up on file.

## 2012-12-15 ENCOUNTER — Other Ambulatory Visit (INDEPENDENT_AMBULATORY_CARE_PROVIDER_SITE_OTHER): Payer: Medicare HMO

## 2012-12-15 ENCOUNTER — Other Ambulatory Visit: Payer: Self-pay | Admitting: *Deleted

## 2012-12-15 DIAGNOSIS — Z79899 Other long term (current) drug therapy: Secondary | ICD-10-CM

## 2012-12-15 DIAGNOSIS — E785 Hyperlipidemia, unspecified: Secondary | ICD-10-CM

## 2012-12-15 LAB — LIPID PANEL
HDL: 60.2 mg/dL (ref >39.00)
Total CHOL/HDL Ratio: 3
VLDL: 13.4 mg/dL (ref 0.0–40.0)

## 2012-12-15 LAB — COMPREHENSIVE METABOLIC PANEL
ALT: 36 U/L — ABNORMAL HIGH (ref 0–35)
AST: 30 U/L (ref 0–37)
CO2: 32 mEq/L (ref 19–32)
Chloride: 101 mEq/L (ref 96–112)
Creatinine, Ser: 0.5 mg/dL (ref 0.4–1.2)
GFR: 144.16 mL/min (ref >60.00)
Sodium: 137 mEq/L (ref 135–145)
Total Bilirubin: 0.4 mg/dL (ref 0.3–1.2)
Total Protein: 6.9 g/dL (ref 6.0–8.3)

## 2012-12-15 NOTE — Telephone Encounter (Signed)
Faxed script to mail order as instructed

## 2012-12-16 ENCOUNTER — Encounter: Payer: Self-pay | Admitting: Internal Medicine

## 2012-12-16 DIAGNOSIS — R7989 Other specified abnormal findings of blood chemistry: Secondary | ICD-10-CM | POA: Insufficient documentation

## 2012-12-16 NOTE — Assessment & Plan Note (Addendum)
AST is slightly elevated and she is on statin therapy. We will repeat a hepatic panel in 4 weeks.

## 2012-12-16 NOTE — Assessment & Plan Note (Signed)
Well controlled on current regimen. Renal function stable, no changes today. 

## 2012-12-16 NOTE — Assessment & Plan Note (Signed)
Controlled with statin therapy. LDL is at goal today. AST is mildly elevated at 36 and we will repeat this in one month.

## 2012-12-16 NOTE — Progress Notes (Deleted)
Patient ID: Diamond Walsh, female   DOB: Feb 20, 1935, 78 y.o.   MRN: [id number]  Patient Active Problem List  Diagnosis  . Osteoporosis  . Hyperlipidemia  . Hypertension  . Asthma  . Depression  . Diverticulitis  . Allergy  . SVT (supraventricular tachycardia)  . Abdominal pain, left upper quadrant  . Venous insufficiency (chronic) (peripheral)  . Other abnormal blood chemistry    Subjective:  CC:   Chief Complaint  Patient presents with  . Follow-up    6 month please look at dosage on lisinopril patient states 5 mg daily.    HPI:   Diamond Walsh a 78 y.o. female who presents for six-month followup on chronic medical problems. She had an episode of nausea without vomiting which lasted one to 2 days in January and resolved spontaneously without hematuria Past Medical History  Diagnosis Date  . Osteoporosis   . Hyperlipidemia   . Hypertension   . Cancer 2011    Colon , Stage III  . Asthma   . Depression   . Diverticulitis   . Allergy   . Atrial fibrillation   . Cervical dysplasia   . Wrist fracture, right   . Venous insufficiency (chronic) (peripheral)     Past Surgical History  Procedure Laterality Date  . Wrist fracture repaired    . Colon surgery  Dec 2009    transverse colectomy, suspicious lesion       The following portions of the patient's history were reviewed and updated as appropriate: Allergies, current medications, and problem list.    Review of Systems:   Patient denies headache, fevers, malaise, unintentional weight loss, skin rash, eye pain, sinus congestion and sinus pain, sore throat, dysphagia,  hemoptysis , cough, dyspnea, wheezing, chest pain, palpitations, orthopnea, edema, abdominal pain, nausea, melena, diarrhea, constipation, flank pain, dysuria, hematuria, urinary  Frequency, nocturia, numbness, tingling, seizures,  Focal weakness, Loss of consciousness,  Tremor, insomnia, depression, anxiety, and suicidal ideation.      History    Social History  . Marital Status: Married    Spouse Name: N/A    Number of Children: N/A  . Years of Education: N/A   Occupational History  . Not on file.   Social History Main Topics  . Smoking status: Never Smoker   . Smokeless tobacco: Never Used  . Alcohol Use: 1.8 oz/week    3 Glasses of wine per week  . Drug Use: No  . Sexually Active: Not Currently   Other Topics Concern  . Not on file   Social History Narrative  . No narrative on file    Objective:  BP 136/68  Pulse 78  Temp(Src) 98.1 F (36.7 C) (Oral)  Resp 14  Wt 156 lb 12 oz (71.101 kg)  BMI 25.68 kg/m2  SpO2 96%  General appearance: alert, cooperative and appears stated age Ears: normal TM's and external ear canals both ears Throat: lips, mucosa, and tongue normal; teeth and gums normal Neck: no adenopathy, no carotid bruit, supple, symmetrical, trachea midline and thyroid not enlarged, symmetric, no tenderness/mass/nodules Back: symmetric, no curvature. ROM normal. No CVA tenderness. Lungs: clear to auscultation bilaterally Heart: regular rate and rhythm, S1, S2 normal, no murmur, click, rub or gallop Abdomen: soft, non-tender; bowel sounds normal; no masses,  no organomegaly Pulses: 2+ and symmetric Skin: Skin color, texture, turgor normal. No rashes or lesions Lymph nodes: Cervical, supraclavicular, and axillary nodes normal.  Assessment and Plan:  Hyperlipidemia Controlled with statin therapy. LDL  is at goal today. AST is mildly elevated at 36 and we will repeat this in one month.  Depression , Complicated by prolonged grief reaction from death of sister. Symptoms are currently controlled with Celexa. No changes today. She plans to  consider tapering off of the medication in 3 months.  SVT (supraventricular tachycardia) Rate controlled on diltiazem. Accompanied by mild valvular disease and normal EF by last echo November 2013. She is followed biannually by [facility].  S  Hypertension Well controlled on current regimen. Renal function stable, no changes today.  Other abnormal blood chemistry AST is slightly elevated and she is on statin therapy. We will repeat a hepatic panel in 4 weeks.   Updated Medication List Outpatient Encounter Prescriptions as of 12/14/2012  Medication Sig Dispense Refill  . aspirin 81 MG tablet Take 81 mg by mouth daily.        . citalopram (CELEXA) 20 MG tablet Take 1 tablet (20 mg total) by mouth daily.  90 tablet  3  . diltiazem (CARDIZEM CD) 120 MG 24 hr capsule Take 1 capsule (120 mg total) by mouth daily.  90 capsule  3  . lisinopril (PRINIVIL,ZESTRIL) 5 MG tablet Take 1 tablet (5 mg total) by mouth daily.  90 tablet  3  . Multiple Vitamins-Minerals (PRESERVISION AREDS 2 PO) Take by mouth.        . pravastatin (PRAVACHOL) 40 MG tablet Take 1 tablet (40 mg total) by mouth daily.  90 tablet  3  . tiotropium (SPIRIVA) 18 MCG inhalation capsule Place 1 capsule (18 mcg total) into inhaler and inhale daily.  90 capsule  3  . [DISCONTINUED] albuterol (PROVENTIL,VENTOLIN) 90 MCG/ACT inhaler Inhale 2 puffs into the lungs every 6 (six) hours as needed.  3 Act  3  . [DISCONTINUED] diltiazem (CARDIZEM CD) 120 MG 24 hr capsule Take 1 capsule (120 mg total) by mouth daily.  90 capsule  3  . [DISCONTINUED] lisinopril (PRINIVIL,ZESTRIL) 5 MG tablet Take 4 tablets (20 mg total) by mouth daily.  90 tablet  3  . [DISCONTINUED] pravastatin (PRAVACHOL) 40 MG tablet Take 1 tablet (40 mg total) by mouth daily.  90 tablet  3  . albuterol (PROVENTIL HFA;VENTOLIN HFA) 108 (90 BASE) MCG/ACT inhaler Inhale 2 puffs into the lungs every 6 (six) hours as needed for wheezing.  6.7 g  1  . traMADol (ULTRAM) 50 MG tablet Take 1 tablet (50 mg total) by mouth every 8 (eight) hours as needed for pain.  90 tablet  3  . [DISCONTINUED] metaproterenol (ALUPENT) 10 MG tablet Take 10 mg by mouth as needed.       No facility-administered encounter medications on file  as of 12/14/2012.     Orders Placed This Encounter  Procedures  . [facility]GR[facility]  . Comprehensive metabolic panel  . Lipid panel  . Hepatic function panel    No Follow-up on file.

## 2012-12-16 NOTE — Progress Notes (Signed)
Patient ID: Diamond Walsh, female   DOB: 03/30/1935, 78 y.o.   MRN: [id number]   Patient Active Problem List  Diagnosis  . Osteoporosis  . Hyperlipidemia  . Hypertension  . Asthma  . Depression  . Diverticulitis  . Allergy  . SVT (supraventricular tachycardia)  . Abdominal pain, left upper quadrant  . Venous insufficiency (chronic) (peripheral)  . Other abnormal blood chemistry    Subjective:  CC:   Chief Complaint  Patient presents with  . Follow-up    6 month please look at dosage on lisinopril patient states 5 mg daily.    HPI:   Diamond Walsh a 78 y.o. female who presents for six-month followup on chronic conditions including hyperlipidemia hypertension and depression. She recently resumed her antidepressant due to prolonged grief reaction from the expected death of her sister who died from respiratory failure following heart surgery. She's tolerating citalopram without palpitations. She plans to taper herself off of it in several months. She had an episode of nausea lasting one to 2 days in January. Symptoms resolved without emesis hematuria or diarrhea. She does have a history of nephrolithiasis and diverticulitis. She feels generally well. She is walking regularly. She is maintaining her weight since losing 15 pounds intentionally last fall. She has seen her cardiologist in the last 6 months and no changes were made to her medication regimen. Her last echo showed normal EF and mild MR and TR.    Past Medical History  Diagnosis Date  . Osteoporosis   . Hyperlipidemia   . Hypertension   . Cancer 2011    Colon , Stage III  . Asthma   . Depression   . Diverticulitis   . Allergy   . Atrial fibrillation   . Cervical dysplasia   . Wrist fracture, right   . Venous insufficiency (chronic) (peripheral)     Past Surgical History  Procedure Laterality Date  . Wrist fracture repaired    . Colon surgery  Dec 2009    transverse colectomy, suspicious lesion        The following portions of the patient's history were reviewed and updated as appropriate: Allergies, current medications, and problem list.    Review of Systems:   12 Pt  review of systems was negative except those addressed in the HPI,     History   Social History  . Marital Status: Married    Spouse Name: N/A    Number of Children: N/A  . Years of Education: N/A   Occupational History  . Not on file.   Social History Main Topics  . Smoking status: Never Smoker   . Smokeless tobacco: Never Used  . Alcohol Use: 1.8 oz/week    3 Glasses of wine per week  . Drug Use: No  . Sexually Active: Not Currently   Other Topics Concern  . Not on file   Social History Narrative  . No narrative on file    Objective:  BP 136/68  Pulse 78  Temp(Src) 98.1 F (36.7 C) (Oral)  Resp 14  Wt 156 lb 12 oz (71.101 kg)  BMI 25.68 kg/m2  SpO2 96%  General appearance: alert, cooperative and appears stated age Ears: normal TM's and external ear canals both ears Throat: lips, mucosa, and tongue normal; teeth and gums normal Neck: no adenopathy, no carotid bruit, supple, symmetrical, trachea midline and thyroid not enlarged, symmetric, no tenderness/mass/nodules Back: symmetric, no curvature. ROM normal. No CVA tenderness. Lungs: clear to auscultation bilaterally  Heart: regular rate and rhythm, S1, S2 normal, no murmur, click, rub or gallop Abdomen: soft, non-tender; bowel sounds normal; no masses,  no organomegaly Pulses: 2+ and symmetric Skin: Skin color, texture, turgor normal. No rashes or lesions Lymph nodes: Cervical, supraclavicular, and axillary nodes normal.  Assessment and Plan:  Hyperlipidemia Controlled with statin therapy. LDL is at goal today. AST is mildly elevated at 36 and we will repeat this in one month.  Depression , Complicated by prolonged grief reaction from death of sister. Symptoms are currently controlled with Celexa. No changes today. She  plans to  consider tapering off of the medication in 3 months.  SVT (supraventricular tachycardia) Rate controlled on diltiazem. Accompanied by mild valvular disease and normal EF by last echo November 2013. She is followed biannually by [facility]. S  Hypertension Well controlled on current regimen. Renal function stable, no changes today.  Other abnormal blood chemistry AST is slightly elevated and she is on statin therapy. We will repeat a hepatic panel in 4 weeks.   Updated Medication List Outpatient Encounter Prescriptions as of 12/14/2012  Medication Sig Dispense Refill  . aspirin 81 MG tablet Take 81 mg by mouth daily.        . citalopram (CELEXA) 20 MG tablet Take 1 tablet (20 mg total) by mouth daily.  90 tablet  3  . diltiazem (CARDIZEM CD) 120 MG 24 hr capsule Take 1 capsule (120 mg total) by mouth daily.  90 capsule  3  . lisinopril (PRINIVIL,ZESTRIL) 5 MG tablet Take 1 tablet (5 mg total) by mouth daily.  90 tablet  3  . Multiple Vitamins-Minerals (PRESERVISION AREDS 2 PO) Take by mouth.        . pravastatin (PRAVACHOL) 40 MG tablet Take 1 tablet (40 mg total) by mouth daily.  90 tablet  3  . tiotropium (SPIRIVA) 18 MCG inhalation capsule Place 1 capsule (18 mcg total) into inhaler and inhale daily.  90 capsule  3  . [DISCONTINUED] albuterol (PROVENTIL,VENTOLIN) 90 MCG/ACT inhaler Inhale 2 puffs into the lungs every 6 (six) hours as needed.  3 Act  3  . [DISCONTINUED] diltiazem (CARDIZEM CD) 120 MG 24 hr capsule Take 1 capsule (120 mg total) by mouth daily.  90 capsule  3  . [DISCONTINUED] lisinopril (PRINIVIL,ZESTRIL) 5 MG tablet Take 4 tablets (20 mg total) by mouth daily.  90 tablet  3  . [DISCONTINUED] pravastatin (PRAVACHOL) 40 MG tablet Take 1 tablet (40 mg total) by mouth daily.  90 tablet  3  . albuterol (PROVENTIL HFA;VENTOLIN HFA) 108 (90 BASE) MCG/ACT inhaler Inhale 2 puffs into the lungs every 6 (six) hours as needed for wheezing.  6.7 g  1  .  traMADol (ULTRAM) 50 MG tablet Take 1 tablet (50 mg total) by mouth every 8 (eight) hours as needed for pain.  90 tablet  3  . [DISCONTINUED] metaproterenol (ALUPENT) 10 MG tablet Take 10 mg by mouth as needed.       No facility-administered encounter medications on file as of 12/14/2012.     Orders Placed This Encounter  Procedures  . [facility] M[facility]MOGRAPHY  . Comprehensive metabolic panel  . Lipid panel  . Hepatic function panel    No Follow-up on file.

## 2012-12-16 NOTE — Assessment & Plan Note (Signed)
,   Complicated by prolonged grief reaction from death of sister. Symptoms are currently controlled with Celexa. No changes today. She plans to  consider tapering off of the medication in 3 months.

## 2012-12-16 NOTE — Assessment & Plan Note (Addendum)
Rate controlled on diltiazem. Accompanied by mild valvular disease and normal EF by last echo November 2013. She is followed biannually by [facility]. S

## 2012-12-22 ENCOUNTER — Ambulatory Visit: Payer: Self-pay | Admitting: Oncology

## 2013-01-02 ENCOUNTER — Ambulatory Visit: Payer: Self-pay | Admitting: Family Medicine

## 2013-01-05 ENCOUNTER — Ambulatory Visit: Payer: Self-pay | Admitting: Gastroenterology

## 2013-01-13 ENCOUNTER — Other Ambulatory Visit: Payer: Medicare HMO

## 2013-01-25 ENCOUNTER — Ambulatory Visit: Payer: Self-pay | Admitting: Gastroenterology

## 2013-03-07 ENCOUNTER — Encounter: Payer: Self-pay | Admitting: Gastroenterology

## 2013-03-22 ENCOUNTER — Encounter: Payer: Self-pay | Admitting: Internal Medicine

## 2013-06-22 ENCOUNTER — Encounter: Payer: Medicare HMO | Admitting: Internal Medicine

## 2013-07-25 ENCOUNTER — Ambulatory Visit: Payer: Self-pay | Admitting: Family Medicine

## 2013-09-18 ENCOUNTER — Ambulatory Visit: Payer: Self-pay | Admitting: Gastroenterology

## 2013-09-27 IMAGING — NM NUCLEAR MEDICINE HEPATOHBILIARY INCLUDE GB
2 series · 12 of 12 positions shown · non-contrast
Comparison: none

REASON FOR EXAM: RUQ abd pain  nausea and vomiting
COMMENTS:

[Series 1000: gallbladder dynamic · 4.80mm/px · 6 of 60 frames shown]
[frame 6/60]
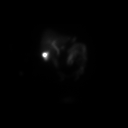
[frame 16/60]
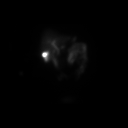
[frame 26/60]
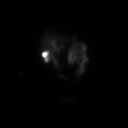
[frame 36/60]
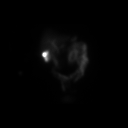
[frame 46/60]
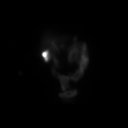
[frame 56/60]
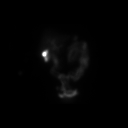

[Series 1000: gallbladder dynamic (results) · 4.80mm/px · 6 of 60 frames shown]
[frame 6/60]
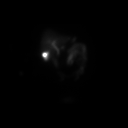
[frame 16/60]
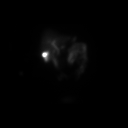
[frame 26/60]
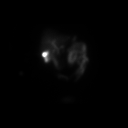
[frame 36/60]
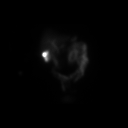
[frame 46/60]
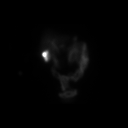
[frame 56/60]
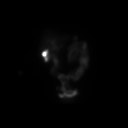

[12 of 12 positions shown; findings below may reference images not displayed]

PROCEDURE:     KNM - KNM HEPATO W/GB EJECT FRACTION  - January 05, 2013 [DATE]

RESULT:     The patient received 8.32 mCi of technetium 99m labeled
Choletec. The patient also received an intravenous drip infusion of 1.4 mcg
of sincalide over 30 minutes.

There is adequate uptake of the radiopharmaceutical by the liver. Activity
is demonstrated within the common bile duct by 15 minutes and within the
gallbladder by 20 minutes. Bowel activity is visible by 30 minutes. The 30
minute gallbladder ejection fraction is at the lower limit of normal at 36%.
The patient experienced minimal "tightness " in the upper abdomen during the
sincalide drip.
IMPRESSION: 1. The hepatobiliary scan is normal.
2. The ejection fraction is at the lower range of normal at 36% with 35%
being considered the low is limit of normal.

[REDACTED]

## 2013-10-03 ENCOUNTER — Ambulatory Visit: Payer: Self-pay | Admitting: Orthopedic Surgery

## 2013-12-18 ENCOUNTER — Ambulatory Visit: Payer: Self-pay | Admitting: Oncology

## 2013-12-19 LAB — COMPREHENSIVE METABOLIC PANEL
ALT: 31 U/L (ref 12–78)
AST: 25 U/L (ref 15–37)
Albumin: 3.9 g/dL (ref 3.4–5.0)
Alkaline Phosphatase: 114 U/L
Anion Gap: 10 (ref 7–16)
BILIRUBIN TOTAL: 0.4 mg/dL (ref 0.2–1.0)
BUN: 19 mg/dL — ABNORMAL HIGH (ref 7–18)
CHLORIDE: 102 mmol/L (ref 98–107)
CO2: 27 mmol/L (ref 21–32)
CREATININE: 0.62 mg/dL (ref 0.60–1.30)
Calcium, Total: 9.5 mg/dL (ref 8.5–10.1)
EGFR (African American): >60
EGFR (Non-African Amer.): >60
Glucose: 163 mg/dL — ABNORMAL HIGH (ref 65–99)
OSMOLALITY: 283 (ref 275–301)
Potassium: 4.1 mmol/L (ref 3.5–5.1)
SODIUM: 139 mmol/L (ref 136–145)
Total Protein: 7.7 g/dL (ref 6.4–8.2)

## 2013-12-19 LAB — CBC CANCER CENTER
BASOS PCT: 0.7 %
Basophil #: 0 x10 3/mm (ref 0.0–0.1)
EOS PCT: 1.3 %
Eosinophil #: 0.1 x10 3/mm (ref 0.0–0.7)
HCT: 41.9 % (ref 35.0–47.0)
HGB: 13.8 g/dL (ref 12.0–16.0)
LYMPHS PCT: 23.5 %
Lymphocyte #: 1.6 x10 3/mm (ref 1.0–3.6)
MCH: 27.2 pg (ref 26.0–34.0)
MCHC: 32.9 g/dL (ref 32.0–36.0)
MCV: 83 fL (ref 80–100)
MONOS PCT: 6.8 %
Monocyte #: 0.5 x10 3/mm (ref 0.2–0.9)
Neutrophil #: 4.6 x10 3/mm (ref 1.4–6.5)
Neutrophil %: 67.7 %
Platelet: 238 x10 3/mm (ref 150–440)
RBC: 5.07 10*6/uL (ref 3.80–5.20)
RDW: 14 % (ref 11.5–14.5)
WBC: 6.8 x10 3/mm (ref 3.6–11.0)

## 2013-12-20 LAB — CEA: CEA: 4.2 ng/mL (ref 0.0–4.7)

## 2013-12-22 ENCOUNTER — Ambulatory Visit: Payer: Self-pay | Admitting: Oncology

## 2014-08-21 ENCOUNTER — Ambulatory Visit: Payer: Self-pay | Admitting: Family Medicine

## 2014-08-30 ENCOUNTER — Ambulatory Visit: Payer: Self-pay | Admitting: Family Medicine

## 2015-10-11 ENCOUNTER — Other Ambulatory Visit: Payer: Self-pay | Admitting: Family Medicine

## 2015-10-11 DIAGNOSIS — Z1231 Encounter for screening mammogram for malignant neoplasm of breast: Secondary | ICD-10-CM

## 2015-10-15 ENCOUNTER — Ambulatory Visit: Payer: Medicare HMO

## 2015-10-24 ENCOUNTER — Ambulatory Visit
Admission: RE | Admit: 2015-10-24 | Discharge: 2015-10-24 | Disposition: A | Discharge status: Home / Self Care | Payer: Medicare HMO | Source: Ambulatory Visit | Attending: Family Medicine | Admitting: Family Medicine

## 2015-10-24 ENCOUNTER — Other Ambulatory Visit: Payer: Self-pay | Admitting: Family Medicine

## 2015-10-24 DIAGNOSIS — Z1231 Encounter for screening mammogram for malignant neoplasm of breast: Secondary | ICD-10-CM | POA: Diagnosis not present

## 2016-09-16 ENCOUNTER — Other Ambulatory Visit: Payer: Self-pay | Admitting: Orthopedic Surgery

## 2016-09-16 DIAGNOSIS — M84359A Stress fracture, hip, unspecified, initial encounter for fracture: Secondary | ICD-10-CM

## 2016-09-23 ENCOUNTER — Other Ambulatory Visit: Payer: Self-pay | Admitting: Family Medicine

## 2016-09-23 DIAGNOSIS — Z1231 Encounter for screening mammogram for malignant neoplasm of breast: Secondary | ICD-10-CM

## 2016-09-25 ENCOUNTER — Ambulatory Visit
Admission: RE | Admit: 2016-09-25 | Discharge: 2016-09-25 | Disposition: A | Discharge status: Home / Self Care | Payer: Medicare HMO | Source: Ambulatory Visit | Attending: Orthopedic Surgery | Admitting: Orthopedic Surgery

## 2016-09-25 DIAGNOSIS — M84359A Stress fracture, hip, unspecified, initial encounter for fracture: Secondary | ICD-10-CM

## 2016-09-25 DIAGNOSIS — M47816 Spondylosis without myelopathy or radiculopathy, lumbar region: Secondary | ICD-10-CM | POA: Diagnosis not present

## 2016-10-26 ENCOUNTER — Ambulatory Visit
Admission: RE | Admit: 2016-10-26 | Discharge: 2016-10-26 | Disposition: A | Discharge status: Home / Self Care | Payer: Medicare HMO | Source: Ambulatory Visit | Attending: Family Medicine | Admitting: Family Medicine

## 2016-10-26 DIAGNOSIS — Z1231 Encounter for screening mammogram for malignant neoplasm of breast: Secondary | ICD-10-CM | POA: Diagnosis not present

## 2017-09-22 ENCOUNTER — Other Ambulatory Visit: Payer: Self-pay | Admitting: Family Medicine

## 2017-09-22 DIAGNOSIS — Z1231 Encounter for screening mammogram for malignant neoplasm of breast: Secondary | ICD-10-CM

## 2017-11-04 ENCOUNTER — Ambulatory Visit
Admission: RE | Admit: 2017-11-04 | Discharge: 2017-11-04 | Disposition: A | Discharge status: Home / Self Care | Payer: Medicare HMO | Source: Ambulatory Visit | Attending: Family Medicine | Admitting: Family Medicine

## 2017-11-04 DIAGNOSIS — Z1231 Encounter for screening mammogram for malignant neoplasm of breast: Secondary | ICD-10-CM

## 2017-11-30 ENCOUNTER — Encounter: Payer: Self-pay | Admitting: Emergency Medicine

## 2017-11-30 ENCOUNTER — Emergency Department: Payer: Medicare HMO

## 2017-11-30 ENCOUNTER — Emergency Department
Admission: EM | Admit: 2017-11-30 | Discharge: 2017-11-30 | Disposition: A | Discharge status: Home / Self Care | Payer: Medicare HMO | Attending: Emergency Medicine | Admitting: Emergency Medicine

## 2017-11-30 ENCOUNTER — Other Ambulatory Visit: Payer: Self-pay

## 2017-11-30 DIAGNOSIS — I10 Essential (primary) hypertension: Secondary | ICD-10-CM | POA: Diagnosis not present

## 2017-11-30 DIAGNOSIS — Z7982 Long term (current) use of aspirin: Secondary | ICD-10-CM | POA: Insufficient documentation

## 2017-11-30 DIAGNOSIS — J45909 Unspecified asthma, uncomplicated: Secondary | ICD-10-CM | POA: Diagnosis not present

## 2017-11-30 DIAGNOSIS — Z79899 Other long term (current) drug therapy: Secondary | ICD-10-CM | POA: Diagnosis not present

## 2017-11-30 DIAGNOSIS — R079 Chest pain, unspecified: Secondary | ICD-10-CM | POA: Insufficient documentation

## 2017-11-30 DIAGNOSIS — Z85038 Personal history of other malignant neoplasm of large intestine: Secondary | ICD-10-CM | POA: Insufficient documentation

## 2017-11-30 LAB — TROPONIN I
Troponin I: <0.03 ng/mL (ref <0.03)
Troponin I: <0.03 ng/mL (ref <0.03)

## 2017-11-30 LAB — BASIC METABOLIC PANEL
ANION GAP: 7 (ref 5–15)
BUN: 11 mg/dL (ref 6–20)
CHLORIDE: 102 mmol/L (ref 101–111)
CO2: 28 mmol/L (ref 22–32)
Calcium: 9.5 mg/dL (ref 8.9–10.3)
Creatinine, Ser: 0.55 mg/dL (ref 0.44–1.00)
GFR calc non Af Amer: >60 mL/min (ref >60)
Glucose, Bld: 110 mg/dL — ABNORMAL HIGH (ref 65–99)
POTASSIUM: 3.9 mmol/L (ref 3.5–5.1)
SODIUM: 137 mmol/L (ref 135–145)

## 2017-11-30 LAB — CBC
HEMATOCRIT: 44.6 % (ref 35.0–47.0)
HEMOGLOBIN: 14.3 g/dL (ref 12.0–16.0)
MCH: 27.5 pg (ref 26.0–34.0)
MCHC: 32 g/dL (ref 32.0–36.0)
MCV: 85.8 fL (ref 80.0–100.0)
Platelets: 249 10*3/uL (ref 150–440)
RBC: 5.19 MIL/uL (ref 3.80–5.20)
RDW: 14.7 % — ABNORMAL HIGH (ref 11.5–14.5)
WBC: 5.2 10*3/uL (ref 3.6–11.0)

## 2017-11-30 NOTE — Discharge Instructions (Addendum)
Please return for worsening chest pain. Please call Dr. Nehemiah Massed in the morning he or his partners should be able to follow you up tomorrow or the next day

## 2017-11-30 NOTE — ED Notes (Signed)
Updated patient at this time and apologized for the long wait.  Patient has a tremor in her left arm at this time.  Patient states tremor began while she was in triage.  Patient states she is continuing to have continual squeezing pain.

## 2017-11-30 NOTE — ED Provider Notes (Signed)
[facility] Emergency Department Provider Note   ____________________________________________   First MD Initiated Contact with Patient 11/30/17 1832     (approximate)  I have reviewed the triage vital signs and the nursing notes.   HISTORY  Chief Complaint Chest Pain    HPI Diamond Walsh is a 83 y.o. female Patient comes in reporting squeezing chest pain last night lasted for 5-10 minutes and then went away this morning she was nauseated and around noon she began having some squeezing chest pain with nausea again no shortness of breath or sweating or radiation of the pain. Nothing seemed to make it better or worse. There is no pain on palpation of the chest. At present pain will come last for a few seconds and go away for a few seconds and come back for a few seconds and go away for a few seconds etc.at present pain is not severe   Past Medical History:  Diagnosis Date  . Allergy   . Asthma   . [place]   . Cancer [facility]) 2011   Colon , Stage III  . Cervical dysplasia   . Depression   . Diverticulitis   . Hyperlipidemia   . Hypertension   . Osteoporosis   . Venous insufficiency (chronic) (peripheral)   . Wrist fracture, right     Patient Active Problem List   Diagnosis Date Noted  . Other abnormal blood chemistry 12/16/2012  . Venous insufficiency (chronic) (peripheral)   . Abdominal pain, left upper quadrant 08/04/2011  . Osteoporosis   . Hyperlipidemia   . Hypertension   . Asthma   . Depression   . Diverticulitis   . Allergy   . SVT (supraventricular tachycardia) (HCC)     Past Surgical History:  Procedure Laterality Date  . COLON SURGERY  Dec 2009   transverse colectomy, suspicious lesion  . wrist fracture repaired      Prior to Admission medications   Medication Sig Start Date End Date Taking? Authorizing Provider  albuterol (PROVENTIL HFA;VENTOLIN HFA) 108 (90 BASE) MCG/ACT inhaler Inhale 2 puffs into the  lungs every 6 (six) hours as needed for wheezing. 12/14/12   Crecencio Mc, MD  aspirin 81 MG tablet Take 81 mg by mouth daily.      [provider]  citalopram (CELEXA) 20 MG tablet Take 1 tablet (20 mg total) by mouth daily. 12/02/11   Crecencio Mc, MD  diltiazem (CARDIZEM CD) 120 MG 24 hr capsule Take 1 capsule (120 mg total) by mouth daily. 12/14/12   Crecencio Mc, MD  lisinopril (PRINIVIL,ZESTRIL) 5 MG tablet Take 1 tablet (5 mg total) by mouth daily. 12/14/12 12/14/13  Crecencio Mc, MD  Multiple Vitamins-Minerals (PRESERVISION AREDS 2 PO) Take by mouth.      [provider]  pravastatin (PRAVACHOL) 40 MG tablet Take 1 tablet (40 mg total) by mouth daily. 12/14/12   Crecencio Mc, MD  tiotropium (SPIRIVA) 18 MCG inhalation capsule Place 1 capsule (18 mcg total) into inhaler and inhale daily. 06/12/11   Crecencio Mc, MD  traMADol (ULTRAM) 50 MG tablet Take 1 tablet (50 mg total) by mouth every 8 (eight) hours as needed for pain. 12/14/12   Crecencio Mc, MD    Allergies Hydrocodone and Percocet [oxycodone-acetaminophen]  Family History  Problem Relation Age of Onset  . Heart disease Mother   . Hyperlipidemia Mother   . Hypertension Mother   . Cancer Father  colon, prostate  . Heart disease Brother   . Hypertension Brother   . Drug abuse Sister   . Breast cancer Neg Hx     Social History Social History   Tobacco Use  . Smoking status: Never Smoker  . Smokeless tobacco: Never Used  Substance Use Topics  . Alcohol use: Yes    Alcohol/week: 4.2 oz    Types: 7 Glasses of wine per week  . Drug use: No    Review of Systems  Constitutional: No fever/chills Eyes: No visual changes. ENT: No sore throat. Cardiovascular:  chest pain. Respiratory: Denies shortness of breath. Gastrointestinal: No abdominal pain.   nausea, no vomiting.  No diarrhea.  No constipation. Genitourinary: Negative for dysuria. Musculoskeletal: Negative for back  pain. Skin: Negative for rash. Neurological: Negative for headaches, focal weakness   ____________________________________________   PHYSICAL EXAM:  VITAL SIGNS: ED Triage Vitals  Enc Vitals Group     BP 11/30/17 1302 (!) 151/80     Pulse Rate 11/30/17 1302 82     Resp 11/30/17 1302 16     Temp 11/30/17 1302 97.8 F (36.6 C)     Temp Source 11/30/17 1302 Oral     SpO2 11/30/17 1302 97 %     Weight 11/30/17 1255 160 lb (72.6 kg)     Height 11/30/17 1255 5\' 6"  (1.676 m)     Head Circumference --      Peak Flow --      Pain Score 11/30/17 1255 4     Pain Loc --      Pain Edu? --      Excl. in [place]? --     Constitutional: Alert and oriented. Well appearing and in no acute distress. Eyes: Conjunctivae are normal. Head: Atraumatic. Nose: No congestion/rhinnorhea. Mouth/Throat: Mucous membranes are moist.  Oropharynx non-erythematous. Neck: No stridor.   Cardiovascular: Normal rate, regular rhythm. Grossly normal heart sounds.  Good peripheral circulation. Respiratory: Normal respiratory effort.  No retractions. Lungs CTAB. Gastrointestinal: Soft and nontender. No distention. No abdominal bruits. No CVA tenderness. Musculoskeletal: No lower extremity tenderness nor edema.  No joint effusions. Neurologic:  Normal speech and language. No gross focal neurologic deficits are appreciated. No gait instability. Skin:  Skin is warm, dry and intact. No rash noted. Psychiatric: Mood and affect are normal. Speech and behavior are normal.  ____________________________________________   LABS (all labs ordered are listed, but only abnormal results are displayed)  Labs Reviewed  BASIC METABOLIC PANEL - Abnormal; Notable for the following components:      Result Value   Glucose, Bld 110 (*)    All other components within normal limits  CBC - Abnormal; Notable for the following components:   RDW 14.7 (*)    All other components within normal limits  TROPONIN I  TROPONIN I    ____________________________________________  EKG  EKG read and interpreted by me shows normal sinus rhythm rate of 90 normal axis there is some sinus arrhythmia but no acute ST-T wave changes ____________________________________________  RADIOLOGY  ED MD interpretation: chest x-ray reviewed I agree with the radiologist no acute disease  Official radiology report(s): Dg Chest 2 View  Result Date: 11/30/2017 CLINICAL DATA:  Midline chest pain for several hours EXAM: CHEST - 2 VIEW COMPARISON:  11/01/2011 FINDINGS: The heart size and mediastinal contours are within normal limits. Both lungs are clear. The visualized skeletal structures are unremarkable. IMPRESSION: No active cardiopulmonary disease. Electronically Signed   By: Linus Mako.D.  On: 11/30/2017 14:00    ____________________________________________   PROCEDURES  Procedure(s) performed:  Procedures  Critical Care performed:   ____________________________________________   INITIAL IMPRESSION / ASSESSMENT AND PLAN / ED COURSE           ____________________________________________   FINAL CLINICAL IMPRESSION(S) / ED DIAGNOSES  Final diagnoses:  Nonspecific chest pain     ED Discharge Orders    None       Note:  This document was prepared using Dragon voice recognition software and may include unintentional dictation errors.    Nena Polio, MD 11/30/17 2012

## 2017-11-30 NOTE — ED Triage Notes (Signed)
Pt to ED from home c/o mid chest pain, non radiating that started last night worse today, nausea but no vomiting, states pain is grabbing, denies SOB.  States has felt "shakey".  Hx of mitral valve regurgitation.

## 2018-04-30 ENCOUNTER — Other Ambulatory Visit: Payer: Self-pay

## 2018-04-30 ENCOUNTER — Encounter: Payer: Self-pay | Admitting: *Deleted

## 2018-04-30 ENCOUNTER — Emergency Department
Admission: EM | Admit: 2018-04-30 | Discharge: 2018-04-30 | Disposition: A | Discharge status: Home / Self Care | Payer: Medicare HMO | Attending: Emergency Medicine | Admitting: Emergency Medicine

## 2018-04-30 ENCOUNTER — Emergency Department: Payer: Medicare HMO

## 2018-04-30 DIAGNOSIS — I10 Essential (primary) hypertension: Secondary | ICD-10-CM | POA: Insufficient documentation

## 2018-04-30 DIAGNOSIS — J45909 Unspecified asthma, uncomplicated: Secondary | ICD-10-CM | POA: Diagnosis not present

## 2018-04-30 DIAGNOSIS — Z85038 Personal history of other malignant neoplasm of large intestine: Secondary | ICD-10-CM | POA: Insufficient documentation

## 2018-04-30 DIAGNOSIS — R112 Nausea with vomiting, unspecified: Secondary | ICD-10-CM

## 2018-04-30 DIAGNOSIS — K5900 Constipation, unspecified: Secondary | ICD-10-CM | POA: Insufficient documentation

## 2018-04-30 DIAGNOSIS — R109 Unspecified abdominal pain: Secondary | ICD-10-CM | POA: Diagnosis not present

## 2018-04-30 LAB — URINALYSIS, COMPLETE (UACMP) WITH MICROSCOPIC
Bacteria, UA: NONE SEEN
Bilirubin Urine: NEGATIVE
GLUCOSE, UA: NEGATIVE mg/dL
KETONES UR: 20 mg/dL — AB
Leukocytes, UA: NEGATIVE
NITRITE: NEGATIVE
PH: 6 (ref 5.0–8.0)
Protein, ur: NEGATIVE mg/dL
Specific Gravity, Urine: 1.012 (ref 1.005–1.030)

## 2018-04-30 LAB — CBC
HCT: 40.7 % (ref 35.0–47.0)
HEMOGLOBIN: 13.9 g/dL (ref 12.0–16.0)
MCH: 29.2 pg (ref 26.0–34.0)
MCHC: 34.2 g/dL (ref 32.0–36.0)
MCV: 85.5 fL (ref 80.0–100.0)
Platelets: 222 10*3/uL (ref 150–440)
RBC: 4.75 MIL/uL (ref 3.80–5.20)
RDW: 14.8 % — ABNORMAL HIGH (ref 11.5–14.5)
WBC: 4.5 10*3/uL (ref 3.6–11.0)

## 2018-04-30 LAB — COMPREHENSIVE METABOLIC PANEL
ALK PHOS: 83 U/L (ref 38–126)
ALT: 15 U/L (ref 0–44)
ANION GAP: 10 (ref 5–15)
AST: 21 U/L (ref 15–41)
Albumin: 4.1 g/dL (ref 3.5–5.0)
BILIRUBIN TOTAL: 0.7 mg/dL (ref 0.3–1.2)
BUN: 12 mg/dL (ref 8–23)
CALCIUM: 9.2 mg/dL (ref 8.9–10.3)
CO2: 28 mmol/L (ref 22–32)
Chloride: 100 mmol/L (ref 98–111)
Creatinine, Ser: 0.65 mg/dL (ref 0.44–1.00)
Glucose, Bld: 127 mg/dL — ABNORMAL HIGH (ref 70–99)
Potassium: 4.1 mmol/L (ref 3.5–5.1)
Sodium: 138 mmol/L (ref 135–145)
TOTAL PROTEIN: 7.4 g/dL (ref 6.5–8.1)

## 2018-04-30 LAB — LIPASE, BLOOD: Lipase: 19 U/L (ref 11–51)

## 2018-04-30 MED ORDER — SODIUM CHLORIDE 0.9 % IV SOLN
Freq: Once | INTRAVENOUS | Status: AC
  Administered 2018-04-30: 20:00:00 via INTRAVENOUS

## 2018-04-30 MED ORDER — ONDANSETRON 4 MG PO TBDP
4.0000 mg | ORAL_TABLET | Freq: Once | ORAL | Status: AC | PRN
  Administered 2018-04-30: 4 mg via ORAL
  Filled 2018-04-30 (×2): qty 1

## 2018-04-30 MED ORDER — METOCLOPRAMIDE HCL 5 MG/ML IJ SOLN
10.0000 mg | Freq: Once | INTRAMUSCULAR | Status: AC
  Administered 2018-04-30: 10 mg via INTRAVENOUS
  Filled 2018-04-30: qty 2

## 2018-04-30 MED ORDER — ONDANSETRON HCL 4 MG/2ML IJ SOLN: 4.0000 mg | Freq: Once | INTRAMUSCULAR | Status: DC

## 2018-04-30 MED ORDER — METOCLOPRAMIDE HCL 10 MG PO TABS: 10.0000 mg | ORAL_TABLET | Freq: Three times a day (TID) | ORAL | 1 refills | Status: AC | PRN

## 2018-04-30 MED ORDER — ONDANSETRON 4 MG PO TBDP: 4.0000 mg | ORAL_TABLET | Freq: Three times a day (TID) | ORAL | 0 refills | Status: AC | PRN

## 2018-04-30 NOTE — ED Notes (Signed)
Patient ambulated with slow, steady gait to and from room commode. NAD.

## 2018-04-30 NOTE — ED Notes (Addendum)
Nausea worse.  Pt vomiting some brownish colored fluid.

## 2018-04-30 NOTE — ED Provider Notes (Signed)
[facility] Emergency Department Provider Note       Time seen: ----------------------------------------- 5:42 PM on 04/30/2018 -----------------------------------------   I have reviewed the triage vital signs and the nursing notes.  HISTORY   Chief Complaint Abdominal Pain; Constipation; and Emesis    HPI Diamond Walsh is a 83 y.o. female with a history of allergies, asthma, atrial fibrillation, colon cancer, depression, diverticulitis, hyperlipidemia, hypertension who presents to the ED for left flank pain and constipation.  Patient states she has not had a bowel movement at all for the past 3 days.  Patient has been taking a laxative without any improvement.  She has had several episodes of vomiting as well, received Zofran prior to my evaluation which helped her nausea.  She denies fevers, chills or other complaints.  Past Medical History:  Diagnosis Date  . Allergy   . Asthma   . Atrial fibrillation ([place])   . Cancer [facility]) 2011   Colon , Stage III  . Cervical dysplasia   . Depression   . Diverticulitis   . Hyperlipidemia   . Hypertension   . Osteoporosis   . Venous insufficiency (chronic) (peripheral)   . Wrist fracture, right     Patient Active Problem List   Diagnosis Date Noted  . Other abnormal blood chemistry 12/16/2012  . Venous insufficiency (chronic) (peripheral)   . Abdominal pain, left upper quadrant 08/04/2011  . Osteoporosis   . Hyperlipidemia   . Hypertension   . Asthma   . Depression   . Diverticulitis   . Allergy   . SVT (supraventricular tachycardia) (HCC)     Past Surgical History:  Procedure Laterality Date  . COLON SURGERY  Dec 2009   transverse colectomy, suspicious lesion  . wrist fracture repaired      Allergies Hydrocodone and Percocet [oxycodone-acetaminophen]  Social History Social History   Tobacco Use  . Smoking status: Never Smoker  . Smokeless tobacco: Never Used  Substance Use Topics  .  Alcohol use: Yes    Alcohol/week: 7.0 standard drinks    Types: 7 Glasses of wine per week  . Drug use: No   Review of Systems Constitutional: Negative for fever. Cardiovascular: Negative for chest pain. Respiratory: Negative for shortness of breath. Gastrointestinal: Positive for flank pain, vomiting and constipation Genitourinary: Negative for dysuria. Musculoskeletal: Negative for back pain. Skin: Negative for rash. Neurological: Negative for headaches, focal weakness or numbness.  All systems negative/normal/unremarkable except as stated in the HPI  ____________________________________________   PHYSICAL EXAM:  VITAL SIGNS: ED Triage Vitals [04/30/18 1612]  Enc Vitals Group     BP (!) 154/73     Pulse Rate 89     Resp 16     Temp 97.6 F (36.4 C)     Temp Source Oral     SpO2 95 %     Weight 161 lb (73 kg)     Height 5\' 6"  (1.676 m)     Head Circumference      Peak Flow      Pain Score 1     Pain Loc      Pain Edu?      Excl. in [place]?    Constitutional: Alert and oriented. Well appearing and in no distress. Eyes: Conjunctivae are normal. Normal extraocular movements. ENT   Head: Normocephalic and atraumatic.   Nose: No congestion/rhinnorhea.   Mouth/Throat: Mucous membranes are moist.   Neck: No stridor. Cardiovascular: Normal rate, regular rhythm. No murmurs, rubs,  or gallops. Respiratory: Normal respiratory effort without tachypnea nor retractions. Breath sounds are clear and equal bilaterally. No wheezes/rales/rhonchi. Gastrointestinal: Mild left flank tenderness, no rebound or guarding.  Normal bowel sounds. Musculoskeletal: Nontender with normal range of motion in extremities. No lower extremity tenderness nor edema. Neurologic:  Normal speech and language. No gross focal neurologic deficits are appreciated.  Skin:  Skin is warm, dry and intact. No rash noted. Psychiatric: Mood and affect are normal. Speech and behavior are normal.   ____________________________________________  ED COURSE:  As part of my medical decision making, I reviewed the following data within the [employer] History obtained from family if available, nursing notes, old chart and ekg, as well as notes from prior ED visits. Patient presented for flank pain and constipation with recent vomiting, we will assess with labs and imaging as indicated at this time.   Procedures ____________________________________________   LABS (pertinent positives/negatives)  Labs Reviewed  COMPREHENSIVE METABOLIC PANEL - Abnormal; Notable for the following components:      Result Value   Glucose, Bld 127 (*)    All other components within normal limits  CBC - Abnormal; Notable for the following components:   RDW 14.8 (*)    All other components within normal limits  URINALYSIS, COMPLETE (UACMP) WITH MICROSCOPIC - Abnormal; Notable for the following components:   Color, Urine YELLOW (*)    APPearance CLEAR (*)    Hgb urine dipstick MODERATE (*)    Ketones, ur 20 (*)    RBC / HPF >50 (*)    All other components within normal limits  URINE CULTURE  LIPASE, BLOOD    RADIOLOGY Images were viewed by me  CT renal protocol  IMPRESSION: No evidence of obstructive uropathy.  8 mm nonobstructive calculus in the lower pole of the right kidney.  Three nonobstructive left renal calculi, measuring up to 4 mm.  Hypoattenuated water density mass in the upper pole of the left kidney demonstrates irregular contours and possible wall calcifications. This may represent a complicated cyst. Sonographic follow-up may be considered.  Gallbladder sludge. ____________________________________________  DIFFERENTIAL DIAGNOSIS   Renal colic, UTI, pyelonephritis, constipation, AAA, colon cancer  FINAL ASSESSMENT AND PLAN  Flank pain, constipation   Plan: The patient had presented for left flank pain and constipation. Patient's labs did reveal some  hematuria otherwise her labs were normal. Patient's imaging did not reveal any obvious acute process.  She has intrarenal kidney stones but none intrarenal.  She did have one episode of vomiting here and we gave her IV Reglan after receiving oral Zofran.  I have advised taking MiraLAX when she is feeling less nauseous.  She is cleared for outpatient follow-up.   Laurence Aly, MD   Note: This note was generated in part or whole with voice recognition software. Voice recognition is usually quite accurate but there are transcription errors that can and very often do occur. I apologize for any typographical errors that were not detected and corrected.     Earleen Newport, MD 04/30/18 2100

## 2018-04-30 NOTE — ED Notes (Signed)
Pt c/o nausea.  

## 2018-04-30 NOTE — ED Notes (Signed)
Dr. Jimmye Norman stated pt does not need an IV at this time.

## 2018-04-30 NOTE — ED Triage Notes (Signed)
Pt to ED reporting NV today and constipation for the past three days. Pt reports taking a laxative last night with no relief. Pt unsure how many episodes of vomiting she has had today but reports "it has been a lot" Pain in left side of abd. No tenderness upon palpation. No fevers. NO changes in urine.

## 2018-04-30 NOTE — ED Triage Notes (Signed)
FIRST NURSE NOTE-here for no bowel movement X 3 days and + vomiting. NAD. Alert.

## 2018-05-02 LAB — URINE CULTURE
Culture: NO GROWTH
Special Requests: NORMAL

## 2018-06-07 ENCOUNTER — Emergency Department
Admission: EM | Admit: 2018-06-07 | Discharge: 2018-06-07 | Disposition: A | Discharge status: Home / Self Care | Payer: Medicare HMO | Attending: Emergency Medicine | Admitting: Emergency Medicine

## 2018-06-07 ENCOUNTER — Encounter: Payer: Self-pay | Admitting: Medical Oncology

## 2018-06-07 ENCOUNTER — Emergency Department: Payer: Medicare HMO

## 2018-06-07 DIAGNOSIS — Z7982 Long term (current) use of aspirin: Secondary | ICD-10-CM | POA: Diagnosis not present

## 2018-06-07 DIAGNOSIS — R002 Palpitations: Secondary | ICD-10-CM | POA: Diagnosis not present

## 2018-06-07 DIAGNOSIS — Z79899 Other long term (current) drug therapy: Secondary | ICD-10-CM | POA: Insufficient documentation

## 2018-06-07 DIAGNOSIS — I10 Essential (primary) hypertension: Secondary | ICD-10-CM | POA: Insufficient documentation

## 2018-06-07 DIAGNOSIS — J45909 Unspecified asthma, uncomplicated: Secondary | ICD-10-CM | POA: Diagnosis not present

## 2018-06-07 DIAGNOSIS — R0789 Other chest pain: Secondary | ICD-10-CM | POA: Diagnosis not present

## 2018-06-07 DIAGNOSIS — R079 Chest pain, unspecified: Secondary | ICD-10-CM | POA: Diagnosis present

## 2018-06-07 LAB — BASIC METABOLIC PANEL
ANION GAP: 10 (ref 5–15)
BUN: 22 mg/dL (ref 8–23)
CALCIUM: 9.1 mg/dL (ref 8.9–10.3)
CO2: 24 mmol/L (ref 22–32)
CREATININE: 0.52 mg/dL (ref 0.44–1.00)
Chloride: 105 mmol/L (ref 98–111)
Glucose, Bld: 108 mg/dL — ABNORMAL HIGH (ref 70–99)
Potassium: 3.9 mmol/L (ref 3.5–5.1)
Sodium: 139 mmol/L (ref 135–145)

## 2018-06-07 LAB — CBC
HCT: 41.1 % (ref 36.0–46.0)
Hemoglobin: 13.4 g/dL (ref 12.0–15.0)
MCH: 28.2 pg (ref 26.0–34.0)
MCHC: 32.6 g/dL (ref 30.0–36.0)
MCV: 86.5 fL (ref 80.0–100.0)
NRBC: 0 % (ref 0.0–0.2)
PLATELETS: 239 10*3/uL (ref 150–400)
RBC: 4.75 MIL/uL (ref 3.87–5.11)
RDW: 13.7 % (ref 11.5–15.5)
WBC: 6.3 10*3/uL (ref 4.0–10.5)

## 2018-06-07 LAB — TROPONIN I

## 2018-06-07 MED ORDER — FAMOTIDINE 20 MG PO TABS
20.0000 mg | ORAL_TABLET | Freq: Once | ORAL | Status: AC
  Administered 2018-06-07: 20 mg via ORAL
  Filled 2018-06-07: qty 1

## 2018-06-07 MED ORDER — ALUM & MAG HYDROXIDE-SIMETH 200-200-20 MG/5ML PO SUSP
30.0000 mL | Freq: Once | ORAL | Status: AC
  Administered 2018-06-07: 30 mL via ORAL
  Filled 2018-06-07: qty 30

## 2018-06-07 NOTE — ED Triage Notes (Signed)
Pt reports this am she was feeling sluggish, states that she also throughout the day has been having episodes of central chest pressure. Denies pain at this time. Denies sob. Pt in NAD at this time. VSS. Pt was here recently with the same and wore halter monitor for a week- has not received results yet.

## 2018-06-07 NOTE — Discharge Instructions (Signed)
Your chest x-ray and labs today were okay.  Please keep your appointment with cardiology in the morning for continued monitoring of your symptoms.

## 2018-06-07 NOTE — ED Provider Notes (Signed)
[facility] Emergency Department Provider Note  ____________________________________________  Time seen: Approximately 11:37 PM  I have reviewed the triage vital signs and the nursing notes.   HISTORY  Chief Complaint Chest Pain    HPI Diamond Walsh is a 83 y.o. female with a history of atrial fibrillation, colon cancer, diverticulitis, hypertension who complains of intermittent central chest pain described as an ache that started this morning.  Last for about 10 seconds at a time and then goes away spontaneously.  No aggravating or alleviating factors.  No shortness of breath diaphoresis or vomiting.  Nonradiating, not pleuritic.  Not exertional.  Compliant with all her medications.  Recently completed Holter monitor a week ago for similar symptoms, has a follow-up appoint with her cardiologist tomorrow to review and reassess.      Past Medical History:  Diagnosis Date  . Allergy   . Asthma   . Atrial fibrillation ([place])   . Cancer [facility]) 2011   Colon , Stage III  . Cervical dysplasia   . Depression   . Diverticulitis   . Hyperlipidemia   . Hypertension   . Osteoporosis   . Venous insufficiency (chronic) (peripheral)   . Wrist fracture, right      Patient Active Problem List   Diagnosis Date Noted  . Other abnormal blood chemistry 12/16/2012  . Venous insufficiency (chronic) (peripheral)   . Abdominal pain, left upper quadrant 08/04/2011  . Osteoporosis   . Hyperlipidemia   . Hypertension   . Asthma   . Depression   . Diverticulitis   . Allergy   . SVT (supraventricular tachycardia) (HCC)      Past Surgical History:  Procedure Laterality Date  . COLON SURGERY  Dec 2009   transverse colectomy, suspicious lesion  . wrist fracture repaired       Prior to Admission medications   Medication Sig Start Date End Date Taking? Authorizing Provider  albuterol (PROVENTIL HFA;VENTOLIN HFA) 108 (90 BASE) MCG/ACT inhaler Inhale 2 puffs into  the lungs every 6 (six) hours as needed for wheezing. 12/14/12   Crecencio Mc, MD  aspirin 81 MG tablet Take 81 mg by mouth daily.      [provider]  citalopram (CELEXA) 20 MG tablet Take 1 tablet (20 mg total) by mouth daily. 12/02/11   Crecencio Mc, MD  diltiazem (CARDIZEM CD) 120 MG 24 hr capsule Take 1 capsule (120 mg total) by mouth daily. 12/14/12   Crecencio Mc, MD  lisinopril (PRINIVIL,ZESTRIL) 5 MG tablet Take 1 tablet (5 mg total) by mouth daily. 12/14/12 12/14/13  Crecencio Mc, MD  metoCLOPramide (REGLAN) 10 MG tablet Take 1 tablet (10 mg total) by mouth every 8 (eight) hours as needed for nausea or vomiting. 04/30/18   Earleen Newport, MD  Multiple Vitamins-Minerals (PRESERVISION AREDS 2 PO) Take by mouth.      [provider]  ondansetron (ZOFRAN ODT) 4 MG disintegrating tablet Take 1 tablet (4 mg total) by mouth every 8 (eight) hours as needed for nausea or vomiting. 04/30/18   Earleen Newport, MD  pravastatin (PRAVACHOL) 40 MG tablet Take 1 tablet (40 mg total) by mouth daily. 12/14/12   Crecencio Mc, MD  tiotropium (SPIRIVA) 18 MCG inhalation capsule Place 1 capsule (18 mcg total) into inhaler and inhale daily. 06/12/11   Crecencio Mc, MD  traMADol (ULTRAM) 50 MG tablet Take 1 tablet (50 mg total) by mouth every 8 (eight) hours as needed  for pain. 12/14/12   Crecencio Mc, MD     Allergies Hydrocodone and Percocet [oxycodone-acetaminophen]   Family History  Problem Relation Age of Onset  . Heart disease Mother   . Hyperlipidemia Mother   . Hypertension Mother   . Cancer Father        colon, prostate  . Heart disease Brother   . Hypertension Brother   . Drug abuse Sister   . Breast cancer Neg Hx     Social History Social History   Tobacco Use  . Smoking status: Never Smoker  . Smokeless tobacco: Never Used  Substance Use Topics  . Alcohol use: Yes    Alcohol/week: 7.0 standard drinks    Types: 7 Glasses of wine per week   . Drug use: No    Review of Systems  Constitutional:   No fever or chills.  ENT:   No sore throat. No rhinorrhea. Cardiovascular:   Positive as above chest pain without syncope. Respiratory:   No dyspnea or cough. Gastrointestinal:   Negative for abdominal pain, vomiting and diarrhea.  Musculoskeletal:   Negative for focal pain or swelling All other systems reviewed and are negative except as documented above in ROS and HPI.  ____________________________________________   PHYSICAL EXAM:  VITAL SIGNS: ED Triage Vitals  Enc Vitals Group     BP 06/07/18 1812 (!) 161/68     Pulse Rate 06/07/18 1812 79     Resp 06/07/18 1813 (!) 22     Temp 06/07/18 1812 98.1 F (36.7 C)     Temp Source 06/07/18 1812 Oral     SpO2 06/07/18 1812 97 %     Weight 06/07/18 1814 160 lb 15 oz (73 kg)     Height --      Head Circumference --      Peak Flow --      Pain Score 06/07/18 1814 0     Pain Loc --      Pain Edu? --      Excl. in [place]? --     Vital signs reviewed, nursing assessments reviewed.   Constitutional:   Alert and oriented. Non-toxic appearance. Eyes:   Conjunctivae are normal. EOMI. PERRL. ENT      Head:   Normocephalic and atraumatic.      Nose:   No congestion/rhinnorhea.       Mouth/Throat:   MMM, no pharyngeal erythema. No peritonsillar mass.       Neck:   No meningismus. Full ROM. Hematological/Lymphatic/Immunilogical:   No cervical lymphadenopathy. Cardiovascular:   Irregular rhythm, occasional ectopic beats on the monitor. Symmetric bilateral radial and DP pulses.  No murmurs. Cap refill less than 2 seconds. Respiratory:   Normal respiratory effort without tachypnea/retractions. Breath sounds are clear and equal bilaterally. No wheezes/rales/rhonchi. Gastrointestinal:   Soft and nontender. Non distended. There is no CVA tenderness.  No rebound, rigidity, or guarding. Musculoskeletal:   Normal range of motion in all extremities. No joint effusions.  No lower extremity  tenderness.  No edema. Neurologic:   Normal speech and language.  Motor grossly intact. No acute focal neurologic deficits are appreciated.  Skin:    Skin is warm, dry and intact. No rash noted.  No petechiae, purpura, or bullae.  ____________________________________________    LABS (pertinent positives/negatives) (all labs ordered are listed, but only abnormal results are displayed) Labs Reviewed  BASIC METABOLIC PANEL - Abnormal; Notable for the following components:      Result Value  Glucose, Bld 108 (*)    All other components within normal limits  CBC  TROPONIN I  TROPONIN I  TSH   ____________________________________________   EKG  Interpreted by me  Date: 06/07/2018  Rate: 84  Rhythm: normal sinus rhythm  QRS Axis: normal  Intervals: normal  ST/T Wave abnormalities: normal  Conduction Disutrbances: none  Narrative Interpretation: unremarkable      ____________________________________________    RADIOLOGY  Dg Chest 2 View  Result Date: 06/07/2018 CLINICAL DATA:  Chest pain EXAM: CHEST - 2 VIEW COMPARISON:  11/30/2017 FINDINGS: No focal opacity or pleural effusion. Heart size within normal limits. No pneumothorax. Interval mild superior endplate deformity at the thoracolumbar junction IMPRESSION: No active cardiopulmonary disease. Electronically Signed   By: Donavan Foil M.D.   On: 06/07/2018 19:03    ____________________________________________   PROCEDURES Procedures  ____________________________________________  DIFFERENTIAL DIAGNOSIS   Symptomatic palpitations, ectopy, non-STEMI, GERD  CLINICAL IMPRESSION / ASSESSMENT AND PLAN / ED COURSE  Pertinent labs & imaging results that were available during my care of the patient were reviewed by me and considered in my medical decision making (see chart for details).    Patient presents with atypical chest discomfort.  Highly doubt ACS PE dissection AAA pneumothorax pericarditis pneumonia.   Given her age and comorbidities, check chest x-ray and troponins x2 which were all unremarkable.  Given antacids in case this helps.  Reviewed electronic medical record, saw note from cardiology entered yesterday on her recent Holter monitor which shows that she has had some ectopic beats but no underlying dysrhythmia.  She has an appointment with her cardiologist in the morning to follow-up.  Recommended she continue all her home medications and let them reassess her blood pressure and medication regimen tomorrow.  I am hesitant to change anything since she is about to see her specialist and appears to be stable at this time..      ____________________________________________   FINAL CLINICAL IMPRESSION(S) / ED DIAGNOSES    Final diagnoses:  Palpitations  Atypical chest pain     ED Discharge Orders    None      Portions of this note were generated with dragon dictation software. Dictation errors may occur despite best attempts at proofreading.    Carrie Mew, MD 06/07/18 2340

## 2018-06-07 NOTE — ED Notes (Addendum)
PT states having center chest pain starting this morning. Husband at bedside. Pt states she was her about a month ago for the same thing. Reports that today her BP was "unusually high" for her at 166/102. She states she also felt more anxious than normal this morning.

## 2018-09-27 ENCOUNTER — Other Ambulatory Visit: Payer: Self-pay | Admitting: Family Medicine

## 2018-09-27 DIAGNOSIS — Z1231 Encounter for screening mammogram for malignant neoplasm of breast: Secondary | ICD-10-CM

## 2019-02-09 ENCOUNTER — Ambulatory Visit
Admission: RE | Admit: 2019-02-09 | Discharge: 2019-02-09 | Disposition: A | Discharge status: Home / Self Care | Payer: Medicare HMO | Source: Ambulatory Visit | Attending: Family Medicine | Admitting: Family Medicine

## 2019-02-09 ENCOUNTER — Other Ambulatory Visit: Payer: Self-pay

## 2019-02-09 DIAGNOSIS — Z1231 Encounter for screening mammogram for malignant neoplasm of breast: Secondary | ICD-10-CM | POA: Diagnosis present

## 2019-09-18 ENCOUNTER — Ambulatory Visit: Payer: Medicare HMO | Attending: Internal Medicine

## 2019-09-18 DIAGNOSIS — Z23 Encounter for immunization: Secondary | ICD-10-CM

## 2019-09-18 NOTE — Progress Notes (Signed)
   Covid-19 Vaccination Clinic  Name:  Diamond Walsh    MRN: [id number] DOB: 1934/10/20  09/18/2019  [name] was observed post Covid-19 immunization for 15 minutes without incidence. She was provided with Vaccine Information Sheet and instruction to access the V-Safe system.   [name] was instructed to call 911 with any severe reactions post vaccine: Marland Kitchen Difficulty breathing  . Swelling of your face and throat  . A fast heartbeat  . A bad rash all over your body  . Dizziness and weakness    Immunizations Administered    Name Date Dose VIS Date Route   Pfizer COVID-19 Vaccine 09/18/2019 12:01 PM 0.3 mL 08/04/2019 Intramuscular   Manufacturer: [place]   Lot: [id number]   [place]: [id number]

## 2019-10-09 ENCOUNTER — Ambulatory Visit: Payer: Medicare HMO | Attending: Internal Medicine

## 2019-10-09 DIAGNOSIS — Z23 Encounter for immunization: Secondary | ICD-10-CM

## 2019-10-09 NOTE — Progress Notes (Signed)
   Covid-19 Vaccination Clinic  Name:  Diamond Walsh    MRN: [id number] DOB: 09-Jul-1935  10/09/2019  [name] was observed post Covid-19 immunization for 15 minutes without incidence. She was provided with Vaccine Information Sheet and instruction to access the V-Safe system.   [name] was instructed to call 911 with any severe reactions post vaccine: Marland Kitchen Difficulty breathing  . Swelling of your face and throat  . A fast heartbeat  . A bad rash all over your body  . Dizziness and weakness    Immunizations Administered    Name Date Dose VIS Date Route   Pfizer COVID-19 Vaccine 10/09/2019 11:36 AM 0.3 mL 08/04/2019 Intramuscular   Manufacturer: [place]   Lot: [id number]   [place]: [id number]

## 2020-01-02 ENCOUNTER — Other Ambulatory Visit: Payer: Self-pay | Admitting: Family Medicine

## 2020-01-02 DIAGNOSIS — Z1231 Encounter for screening mammogram for malignant neoplasm of breast: Secondary | ICD-10-CM

## 2020-02-29 ENCOUNTER — Ambulatory Visit
Admission: RE | Admit: 2020-02-29 | Discharge: 2020-02-29 | Disposition: A | Discharge status: Home / Self Care | Payer: Medicare HMO | Source: Ambulatory Visit | Attending: Family Medicine | Admitting: Family Medicine

## 2020-02-29 DIAGNOSIS — Z1231 Encounter for screening mammogram for malignant neoplasm of breast: Secondary | ICD-10-CM

## 2020-09-18 ENCOUNTER — Other Ambulatory Visit: Payer: Self-pay

## 2020-09-18 ENCOUNTER — Encounter: Payer: Self-pay | Admitting: Dermatology

## 2020-09-18 ENCOUNTER — Ambulatory Visit: Payer: Medicare HMO | Admitting: Dermatology

## 2020-09-18 DIAGNOSIS — L578 Other skin changes due to chronic exposure to nonionizing radiation: Secondary | ICD-10-CM | POA: Diagnosis not present

## 2020-09-18 DIAGNOSIS — L821 Other seborrheic keratosis: Secondary | ICD-10-CM | POA: Diagnosis not present

## 2020-09-18 DIAGNOSIS — L820 Inflamed seborrheic keratosis: Secondary | ICD-10-CM | POA: Diagnosis not present

## 2020-09-18 MED ORDER — TRIAMCINOLONE ACETONIDE 0.1 % EX CREA: 1.0000 "application " | TOPICAL_CREAM | Freq: Two times a day (BID) | CUTANEOUS | 2 refills | Status: DC | PRN

## 2020-09-18 NOTE — Patient Instructions (Addendum)
Cryotherapy Aftercare  . Wash gently with soap and water everyday.   Marland Kitchen Apply Vaseline and Band-Aid daily until healed.  Prior to procedure, discussed risks of blister formation, small wound, skin dyspigmentation, or rare scar following cryotherapy.   Seborrheic Keratosis  What causes seborrheic keratoses? Seborrheic keratoses are harmless, common skin growths that first appear during adult life.  As time goes by, more growths appear.  Some people may develop a large number of them.  Seborrheic keratoses appear on both covered and uncovered body parts.  They are not caused by sunlight.  The tendency to develop seborrheic keratoses can be inherited.  They vary in color from skin-colored to gray, brown, or even black.  They can be either smooth or have a rough, warty surface.   Seborrheic keratoses are superficial and look as if they were stuck on the skin.  Under the microscope this type of keratosis looks like layers upon layers of skin.  That is why at times the top layer may seem to fall off, but the rest of the growth remains and re-grows.    Treatment Seborrheic keratoses do not need to be treated, but can easily be removed in the office.  Seborrheic keratoses often cause symptoms when they rub on clothing or jewelry.  Lesions can be in the way of shaving.  If they become inflamed, they can cause itching, soreness, or burning.  Removal of a seborrheic keratosis can be accomplished by freezing, burning, or surgery. If any spot bleeds, scabs, or grows rapidly, please return to have it checked, as these can be an indication of a skin cancer.   Use Triamcinolone cream twice daily as needed for itching.   Topical steroids (such as triamcinolone, fluocinolone, fluocinonide, mometasone, clobetasol, halobetasol, betamethasone, hydrocortisone) can cause thinning and lightening of the skin if they are used for too long in the same area. Your physician has selected the right strength medicine for your  problem and area affected on the body. Please use your medication only as directed by your physician to prevent side effects.

## 2020-09-18 NOTE — Progress Notes (Signed)
   Follow-Up Visit   Subjective  Diamond Walsh is a 86 y.o. female who presents for the following: Skin Problem (Check spot on right cheek. Dur: few months. Irritated at times. Growing. Would like to discuss removal. ).  Also has several other itchy spots on face and neck that constantly itch.    The following portions of the chart were reviewed this encounter and updated as appropriate:      Review of Systems: No other skin or systemic complaints except as noted in HPI or Assessment and Plan.  Objective  Well appearing patient in no apparent distress; mood and affect are within normal limits.  A focused examination was performed including head, including the scalp, face, neck, nose, ears, eyelids, and lips. Relevant physical exam findings are noted in the Assessment and Plan.  Objective  Right malar Cheek x2, right lower cheek x1, left lower neck x2 (5): Erythematous keratotic or waxy stuck-on papule    Assessment & Plan  Inflamed seborrheic keratosis (5) Right malar Cheek x2, right lower cheek x1, left lower neck x2  Start Triamcinolone cream BID PRN itching to ISKs body .  Topical steroids (such as triamcinolone, fluocinolone, fluocinonide, mometasone, clobetasol, halobetasol, betamethasone, hydrocortisone) can cause thinning and lightening of the skin if they are used for too long in the same area. Your physician has selected the right strength medicine for your problem and area affected on the body. Please use your medication only as directed by your physician to prevent side effects.    Destruction of lesion - Right malar Cheek x2, right lower cheek x1, left lower neck x2  Destruction method: cryotherapy   Informed consent: discussed and consent obtained   Lesion destroyed using liquid nitrogen: Yes   Region frozen until ice ball extended beyond lesion: Yes   Outcome: patient tolerated procedure well with no complications   Post-procedure details: wound care instructions  given    triamcinolone (KENALOG) 0.1 % - Right malar Cheek x2, right lower cheek x1, left lower neck x2  Seborrheic Keratoses at face and neck - Stuck-on, waxy, tan-brown papules and plaques  - Discussed benign etiology and prognosis. - Observe - Call for any changes  Actinic Damage at face - chronic, secondary to cumulative UV radiation exposure/sun exposure over time - diffuse scaly erythematous macules with underlying dyspigmentation - Recommend daily broad spectrum sunscreen SPF 30+ to sun-exposed areas, reapply every 2 hours as needed.  - Call for new or changing lesions.   Return if symptoms worsen or fail to improve.   I, Emelia Salisbury, CMA, am acting as scribe for Brendolyn Patty, MD.  Documentation: I have reviewed the above documentation for accuracy and completeness, and I agree with the above.  Brendolyn Patty MD

## 2021-02-04 ENCOUNTER — Other Ambulatory Visit: Payer: Self-pay | Admitting: Family Medicine

## 2021-02-04 DIAGNOSIS — Z1231 Encounter for screening mammogram for malignant neoplasm of breast: Secondary | ICD-10-CM

## 2021-03-31 ENCOUNTER — Other Ambulatory Visit: Payer: Self-pay

## 2021-03-31 ENCOUNTER — Ambulatory Visit
Admission: RE | Admit: 2021-03-31 | Discharge: 2021-03-31 | Disposition: A | Discharge status: Home / Self Care | Payer: Medicare HMO | Source: Ambulatory Visit | Attending: Family Medicine | Admitting: Family Medicine

## 2021-03-31 DIAGNOSIS — Z1231 Encounter for screening mammogram for malignant neoplasm of breast: Secondary | ICD-10-CM

## 2021-07-16 ENCOUNTER — Other Ambulatory Visit: Payer: Self-pay | Admitting: Family Medicine

## 2021-07-16 DIAGNOSIS — R911 Solitary pulmonary nodule: Secondary | ICD-10-CM

## 2021-07-30 ENCOUNTER — Ambulatory Visit
Admission: RE | Admit: 2021-07-30 | Discharge: 2021-07-30 | Disposition: A | Discharge status: Home / Self Care | Payer: Medicare HMO | Source: Ambulatory Visit | Attending: Family Medicine | Admitting: Family Medicine

## 2021-07-30 ENCOUNTER — Other Ambulatory Visit: Payer: Self-pay

## 2021-07-30 DIAGNOSIS — R911 Solitary pulmonary nodule: Secondary | ICD-10-CM | POA: Diagnosis present

## 2021-08-27 ENCOUNTER — Ambulatory Visit: Payer: Medicare HMO | Admitting: Dermatology

## 2022-02-25 ENCOUNTER — Ambulatory Visit: Payer: Medicare HMO | Admitting: Dermatology

## 2022-03-23 ENCOUNTER — Other Ambulatory Visit: Payer: Self-pay | Admitting: Family Medicine

## 2022-03-23 DIAGNOSIS — Z1231 Encounter for screening mammogram for malignant neoplasm of breast: Secondary | ICD-10-CM

## 2022-04-16 ENCOUNTER — Ambulatory Visit
Admission: RE | Admit: 2022-04-16 | Discharge: 2022-04-16 | Disposition: A | Discharge status: Home / Self Care | Payer: Medicare Other | Source: Ambulatory Visit | Attending: Family Medicine | Admitting: Family Medicine

## 2022-04-16 DIAGNOSIS — Z1231 Encounter for screening mammogram for malignant neoplasm of breast: Secondary | ICD-10-CM | POA: Diagnosis present

## 2022-05-01 ENCOUNTER — Other Ambulatory Visit: Payer: Self-pay | Admitting: Sports Medicine

## 2022-05-01 DIAGNOSIS — G8929 Other chronic pain: Secondary | ICD-10-CM

## 2022-05-01 DIAGNOSIS — M5137 Other intervertebral disc degeneration, lumbosacral region: Secondary | ICD-10-CM

## 2022-05-01 DIAGNOSIS — S32020S Wedge compression fracture of second lumbar vertebra, sequela: Secondary | ICD-10-CM

## 2022-05-01 DIAGNOSIS — M47816 Spondylosis without myelopathy or radiculopathy, lumbar region: Secondary | ICD-10-CM

## 2022-05-08 ENCOUNTER — Ambulatory Visit
Admission: RE | Admit: 2022-05-08 | Discharge: 2022-05-08 | Disposition: A | Discharge status: Home / Self Care | Payer: Medicare Other | Source: Ambulatory Visit | Attending: Sports Medicine | Admitting: Sports Medicine

## 2022-05-08 DIAGNOSIS — M5442 Lumbago with sciatica, left side: Secondary | ICD-10-CM | POA: Insufficient documentation

## 2022-05-08 DIAGNOSIS — M47816 Spondylosis without myelopathy or radiculopathy, lumbar region: Secondary | ICD-10-CM | POA: Diagnosis present

## 2022-05-08 DIAGNOSIS — M5441 Lumbago with sciatica, right side: Secondary | ICD-10-CM | POA: Diagnosis present

## 2022-05-08 DIAGNOSIS — S32020S Wedge compression fracture of second lumbar vertebra, sequela: Secondary | ICD-10-CM

## 2022-05-08 DIAGNOSIS — M5137 Other intervertebral disc degeneration, lumbosacral region: Secondary | ICD-10-CM | POA: Insufficient documentation

## 2022-05-08 DIAGNOSIS — G8929 Other chronic pain: Secondary | ICD-10-CM

## 2022-06-23 ENCOUNTER — Ambulatory Visit: Payer: Medicare HMO | Admitting: Dermatology

## 2022-06-24 ENCOUNTER — Ambulatory Visit: Payer: Medicare Other | Admitting: Dermatology

## 2022-06-24 ENCOUNTER — Encounter: Payer: Self-pay | Admitting: Dermatology

## 2022-06-24 DIAGNOSIS — L821 Other seborrheic keratosis: Secondary | ICD-10-CM | POA: Diagnosis not present

## 2022-06-24 DIAGNOSIS — L814 Other melanin hyperpigmentation: Secondary | ICD-10-CM | POA: Diagnosis not present

## 2022-06-24 DIAGNOSIS — L578 Other skin changes due to chronic exposure to nonionizing radiation: Secondary | ICD-10-CM | POA: Diagnosis not present

## 2022-06-24 DIAGNOSIS — D1800 Hemangioma unspecified site: Secondary | ICD-10-CM | POA: Diagnosis not present

## 2022-06-24 DIAGNOSIS — L820 Inflamed seborrheic keratosis: Secondary | ICD-10-CM | POA: Diagnosis not present

## 2022-06-24 NOTE — Patient Instructions (Addendum)
Cryotherapy Aftercare  Wash gently with soap and water everyday.   Apply Vaseline and Band-Aid daily until healed.    Continue Triamcinolone 0.1% cream twice daily up to 2 weeks as directed to affected body areas as needed for itching. Avoid applying to face, groin, and axilla. Use as directed. Long-term use can cause thinning of the skin.  Recommend OTC Scalpicin Extra Strength solution for spot treating to affected areas of scalp as needed for itch.     Recommend using Neutrogena T/Sal shampoo 2-3 times per week, lather on scalp, leave on 5-8 minutes, rinse well.    Topical steroids (such as triamcinolone, fluocinolone, fluocinonide, mometasone, clobetasol, halobetasol, betamethasone, hydrocortisone) can cause thinning and lightening of the skin if they are used for too long in the same area. Your physician has selected the right strength medicine for your problem and area affected on the body. Please use your medication only as directed by your physician to prevent side effects.      Seborrheic Keratosis  What causes seborrheic keratoses? Seborrheic keratoses are harmless, common skin growths that first appear during adult life.  As time goes by, more growths appear.  Some people may develop a large number of them.  Seborrheic keratoses appear on both covered and uncovered body parts.  They are not caused by sunlight.  The tendency to develop seborrheic keratoses can be inherited.  They vary in color from skin-colored to gray, brown, or even black.  They can be either smooth or have a rough, warty surface.   Seborrheic keratoses are superficial and look as if they were stuck on the skin.  Under the microscope this type of keratosis looks like layers upon layers of skin.  That is why at times the top layer may seem to fall off, but the rest of the growth remains and re-grows.    Treatment Seborrheic keratoses do not need to be treated, but can easily be removed in the office.  Seborrheic  keratoses often cause symptoms when they rub on clothing or jewelry.  Lesions can be in the way of shaving.  If they become inflamed, they can cause itching, soreness, or burning.  Removal of a seborrheic keratosis can be accomplished by freezing, burning, or surgery. If any spot bleeds, scabs, or grows rapidly, please return to have it checked, as these can be an indication of a skin cancer.    Due to recent changes in healthcare laws, you may see results of your pathology and/or laboratory studies on MyChart before the doctors have had a chance to review them. We understand that in some cases there may be results that are confusing or concerning to you. Please understand that not all results are received at the same time and often the doctors may need to interpret multiple results in order to provide you with the best plan of care or course of treatment. Therefore, we ask that you please give [place] 2 business days to thoroughly review all your results before contacting the office for clarification. Should we see a critical lab result, you will be contacted sooner.   If You Need Anything After Your Visit  If you have any questions or concerns for your doctor, please call our main line at [phone] and press option 4 to reach your doctor's medical assistant. If no one answers, please leave a voicemail as directed and we will return your call as soon as possible. Messages left after 4 pm will be answered the following business day.  You may also send [place] a message via [place]. We typically respond to MyChart messages within 1-2 business days.  For prescription refills, please ask your pharmacy to contact our office. Our fax number is [phone].  If you have an urgent issue when the clinic is closed that cannot wait until the next business day, you can page your doctor at the number below.    Please note that while we do our best to be available for urgent issues outside of office hours, we are  not available 24/7.   If you have an urgent issue and are unable to reach [place], you may choose to seek medical care at your doctor's office, retail clinic, urgent care center, or emergency room.  If you have a medical emergency, please immediately call 911 or go to the emergency department.  Pager Numbers  - Dr. Nehemiah Massed: [phone]  - Dr. Laurence Ferrari: [phone]  - Dr. Nicole Kindred: [phone]  In the event of inclement weather, please call our main line at [phone] for an update on the status of any delays or closures.  Dermatology Medication Tips: Please keep the boxes that topical medications come in in order to help keep track of the instructions about where and how to use these. Pharmacies typically print the medication instructions only on the boxes and not directly on the medication tubes.   If your medication is too expensive, please contact our office at [phone] option 4 or send [place] a message through [place].   We are unable to tell what your co-pay for medications will be in advance as this is different depending on your insurance coverage. However, we may be able to find a substitute medication at lower cost or fill out paperwork to get insurance to cover a needed medication.   If a prior authorization is required to get your medication covered by your insurance company, please allow [place] 1-2 business days to complete this process.  Drug prices often vary depending on where the prescription is filled and some pharmacies may offer cheaper prices.  The website www.goodrx.com contains coupons for medications through different pharmacies. The prices here do not account for what the cost may be with help from insurance (it may be cheaper with your insurance), but the website can give you the price if you did not use any insurance.  - You can print the associated coupon and take it with your prescription to the pharmacy.  - You may also stop by our office during regular business  hours and pick up a GoodRx coupon card.  - If you need your prescription sent electronically to a different pharmacy, notify our office through [facility] or by phone at [phone] option 4.     Si Usted Necesita Algo Despus de Su Visita  Tambin puede enviarnos un mensaje a travs de Pharmacist, community. Por lo general respondemos a los mensajes de MyChart en el transcurso de 1 a 2 das hbiles.  Para renovar recetas, por favor pida a su farmacia que se ponga en contacto con nuestra oficina. Harland Dingwall de fax es [place] [phone].  Si tiene un asunto urgente cuando la clnica est cerrada y que no puede esperar hasta el siguiente da hbil, puede llamar/localizar a su doctor(a) al nmero que aparece a continuacin.   Por favor, tenga en cuenta que aunque hacemos todo lo posible para estar disponibles para asuntos urgentes fuera del horario de [place], no estamos disponibles las 24 horas del da, los 7 das de la [place].  Si tiene un problema urgente y no puede comunicarse con nosotros, puede optar por buscar atencin mdica  en el consultorio de su doctor(a), en una clnica privada, en un centro de atencin urgente o en una sala de emergencias.  Si tiene Engineering geologist, por favor llame inmediatamente al 911 o vaya a la sala de emergencias.  Nmeros de bper  - Dr. Nehemiah Massed: [phone]  - Dra. Moye: [phone]  - Dra. Nicole Kindred: [phone]  En caso de inclemencias del [place], por favor llame a Johnsie Kindred principal al [phone] para una actualizacin sobre el [place] de cualquier retraso o cierre.  Consejos para la medicacin en dermatologa: Por favor, guarde las cajas en las que vienen los medicamentos de uso tpico para ayudarle a seguir las instrucciones sobre dnde y cmo usarlos. Las farmacias generalmente imprimen las instrucciones del medicamento slo en las cajas y no directamente en los tubos del [place].   Si su medicamento es muy caro, por favor,  pngase en contacto con Zigmund Daniel llamando al [phone] y presione la opcin 4 o envenos un mensaje a travs de Pharmacist, community.   No podemos decirle cul ser su copago por los medicamentos por adelantado ya que esto es diferente dependiendo de la cobertura de su seguro. Sin embargo, es posible que podamos encontrar un medicamento sustituto a Electrical engineer un formulario para que el seguro cubra el medicamento que se considera necesario.   Si se requiere una autorizacin previa para que su compaa de seguros [place] su medicamento, por favor permtanos de 1 a 2 das hbiles para completar este proceso.  Los precios de los medicamentos varan con frecuencia dependiendo del Environmental consultant de dnde se surte la receta y alguna farmacias pueden ofrecer precios ms baratos.  El sitio web www.goodrx.com tiene cupones para medicamentos de Airline pilot. Los precios aqu no tienen en cuenta lo que podra costar con la ayuda del seguro (puede ser ms barato con su seguro), pero el sitio web puede darle el precio si no utiliz Research scientist (physical sciences).  - Puede imprimir el cupn correspondiente y llevarlo con su receta a la farmacia.  - Tambin puede pasar por nuestra oficina durante el horario de atencin regular y Charity fundraiser una tarjeta de cupones de GoodRx.  - Si necesita que su receta se enve electrnicamente a una farmacia diferente, informe a nuestra oficina a travs de MyChart de [place] o por telfono llamando al [phone] y presione la opcin 4.

## 2022-06-24 NOTE — Progress Notes (Signed)
Follow-Up Visit   Subjective  Diamond Walsh is a 87 y.o. female who presents for the following: Seborrheic Keratosis (Spots on chest and back. Itching, irritated).  The patient has spots, moles and lesions to be evaluated, some may be new or changing and the patient has concerns that these could be cancer.   The following portions of the chart were reviewed this encounter and updated as appropriate:      Review of Systems: No other skin or systemic complaints except as noted in HPI or Assessment and Plan.   Objective  Well appearing patient in no apparent distress; mood and affect are within normal limits.  A focused examination was performed including face, chest, back. Relevant physical exam findings are noted in the Assessment and Plan.  left mid back at bra line x2, left post shoulder x1, right post axilla x1, left lower sternum x1 (5) Erythematous keratotic or waxy stuck-on papule or plaque.   Assessment & Plan  Inflamed seborrheic keratosis (5) left mid back at bra line x2, left post shoulder x1, right post axilla x1, left lower sternum x1  Symptomatic, irritating, patient would like treated.  Advised may take more than one treatment to resolve.   Continue Triamcinolone 0.1% cream twice daily up to 2 weeks as directed to ISKs affected body areas as needed for itching. Avoid applying to face, groin, and axilla. Use as directed. Long-term use can cause thinning of the skin.  Recommend OTC Scalpicin Extra Strength solution for spot treating to ISKs affected areas of scalp as needed for itch.    Recommend using Neutrogena T/Sal shampoo 2-3 times per week, lather on scalp, leave on 5-8 minutes, rinse well.   Topical steroids (such as triamcinolone, fluocinolone, fluocinonide, mometasone, clobetasol, halobetasol, betamethasone, hydrocortisone) can cause thinning and lightening of the skin if they are used for too long in the same area. Your physician has selected the right  strength medicine for your problem and area affected on the body. Please use your medication only as directed by your physician to prevent side effects.    Destruction of lesion - left mid back at bra line x2, left post shoulder x1, right post axilla x1, left lower sternum x1  Destruction method: cryotherapy   Informed consent: discussed and consent obtained   Lesion destroyed using liquid nitrogen: Yes   Region frozen until ice ball extended beyond lesion: Yes   Outcome: patient tolerated procedure well with no complications   Post-procedure details: wound care instructions given   Additional details:  Prior to procedure, discussed risks of blister formation, small wound, skin dyspigmentation, or rare scar following cryotherapy. Recommend Vaseline ointment to treated areas while healing.   Related Medications triamcinolone (KENALOG) 0.1 % Apply 1 application topically 2 (two) times daily as needed. Avoid face, underarms, groin areas   Seborrheic Keratoses. Torso, scalp, arms. - Stuck-on, waxy, tan-brown papules and/or plaques  - Benign-appearing - Discussed benign etiology and prognosis. - Observe - Call for any changes  Lentigines - Scattered tan macules - Due to sun exposure - Benign-appearing, observe - Recommend daily broad spectrum sunscreen SPF 30+ to sun-exposed areas, reapply every 2 hours as needed. - Call for any changes  Hemangiomas - Red papules - Discussed benign nature - Observe - Call for any changes  Actinic Damage - chronic, secondary to cumulative UV radiation exposure/sun exposure over time - diffuse scaly erythematous macules with underlying dyspigmentation - Recommend daily broad spectrum sunscreen SPF 30+ to sun-exposed areas, reapply  every 2 hours as needed.  - Recommend staying in the shade or wearing long sleeves, sun glasses (UVA+UVB protection) and wide brim hats (4-inch brim around the entire circumference of the hat). - Call for new or changing  lesions.   Return in 3 months (on 09/24/2022), or if symptoms worsen or fail to improve, for ISK Follow Up.  I, Emelia Salisbury, CMA, am acting as scribe for Brendolyn Patty, MD.  Documentation: I have reviewed the above documentation for accuracy and completeness, and I agree with the above.  Brendolyn Patty MD

## 2022-07-13 ENCOUNTER — Other Ambulatory Visit: Payer: Self-pay | Admitting: Family Medicine

## 2022-07-13 DIAGNOSIS — R911 Solitary pulmonary nodule: Secondary | ICD-10-CM

## 2022-07-22 ENCOUNTER — Ambulatory Visit
Admission: RE | Admit: 2022-07-22 | Discharge: 2022-07-22 | Disposition: A | Discharge status: Home / Self Care | Payer: Medicare Other | Source: Ambulatory Visit | Attending: Family Medicine | Admitting: Family Medicine

## 2022-07-22 DIAGNOSIS — R911 Solitary pulmonary nodule: Secondary | ICD-10-CM | POA: Insufficient documentation

## 2022-10-05 ENCOUNTER — Ambulatory Visit: Payer: Medicare Other | Admitting: Dermatology

## 2022-10-05 VITALS — BP 122/65 | HR 68

## 2022-10-05 DIAGNOSIS — L814 Other melanin hyperpigmentation: Secondary | ICD-10-CM

## 2022-10-05 DIAGNOSIS — L720 Epidermal cyst: Secondary | ICD-10-CM

## 2022-10-05 DIAGNOSIS — L821 Other seborrheic keratosis: Secondary | ICD-10-CM | POA: Diagnosis not present

## 2022-10-05 DIAGNOSIS — L820 Inflamed seborrheic keratosis: Secondary | ICD-10-CM | POA: Diagnosis not present

## 2022-10-05 NOTE — Patient Instructions (Addendum)
Cryotherapy Aftercare  Wash gently with soap and water everyday.   Apply Vaseline and Band-Aid daily until healed.     Due to recent changes in healthcare laws, you may see results of your pathology and/or laboratory studies on MyChart before the doctors have had a chance to review them. We understand that in some cases there may be results that are confusing or concerning to you. Please understand that not all results are received at the same time and often the doctors may need to interpret multiple results in order to provide you with the best plan of care or course of treatment. Therefore, we ask that you please give us 2 business days to thoroughly review all your results before contacting the office for clarification. Should we see a critical lab result, you will be contacted sooner.   If You Need Anything After Your Visit  If you have any questions or concerns for your doctor, please call our main line at [phone] and press option 4 to reach your doctor's medical assistant. If no one answers, please leave a voicemail as directed and we will return your call as soon as possible. Messages left after 4 pm will be answered the following business day.   You may also send us a message via MyChart. We typically respond to MyChart messages within 1-2 business days.  For prescription refills, please ask your pharmacy to contact our office. Our fax number is [phone].  If you have an urgent issue when the clinic is closed that cannot wait until the next business day, you can page your doctor at the number below.    Please note that while we do our best to be available for urgent issues outside of office hours, we are not available 24/7.   If you have an urgent issue and are unable to reach us, you may choose to seek medical care at your doctor's office, retail clinic, urgent care center, or emergency room.  If you have a medical emergency, please immediately call 911 or go to the  emergency department.  Pager Numbers  - Dr. Kowalski: [phone]  - Dr. Moye: [phone]  - Dr. Stewart: [phone]  In the event of inclement weather, please call our main line at [phone] for an update on the status of any delays or closures.  Dermatology Medication Tips: Please keep the boxes that topical medications come in in order to help keep track of the instructions about where and how to use these. Pharmacies typically print the medication instructions only on the boxes and not directly on the medication tubes.   If your medication is too expensive, please contact our office at [phone] option 4 or send us a message through MyChart.   We are unable to tell what your co-pay for medications will be in advance as this is different depending on your insurance coverage. However, we may be able to find a substitute medication at lower cost or fill out paperwork to get insurance to cover a needed medication.   If a prior authorization is required to get your medication covered by your insurance company, please allow us 1-2 business days to complete this process.  Drug prices often vary depending on where the prescription is filled and some pharmacies may offer cheaper prices.  The website www.goodrx.com contains coupons for medications through different pharmacies. The prices here do not account for what the cost may be with help from insurance (it may be cheaper with your insurance), but the website can   give you the price if you did not use any insurance.  - You can print the associated coupon and take it with your prescription to the pharmacy.  - You may also stop by our office during regular business hours and pick up a GoodRx coupon card.  - If you need your prescription sent electronically to a different pharmacy, notify our office through [place] MyChart or by phone at [phone] option 4.     Si Usted Necesita Algo Despus de Su Visita  Tambin puede  enviarnos un mensaje a travs de MyChart. Por lo general respondemos a los mensajes de MyChart en el transcurso de 1 a 2 das hbiles.  Para renovar recetas, por favor pida a su farmacia que se ponga en contacto con nuestra oficina. Nuestro nmero de fax es el [phone].  Si tiene un asunto urgente cuando la clnica est cerrada y que no puede esperar hasta el siguiente da hbil, puede llamar/localizar a su doctor(a) al nmero que aparece a continuacin.   Por favor, tenga en cuenta que aunque hacemos todo lo posible para estar disponibles para asuntos urgentes fuera del horario de oficina, no estamos disponibles las 24 horas del da, los 7 das de la semana.   Si tiene un problema urgente y no puede comunicarse con nosotros, puede optar por buscar atencin mdica  en el consultorio de su doctor(a), en una clnica privada, en un centro de atencin urgente o en una sala de emergencias.  Si tiene una emergencia mdica, por favor llame inmediatamente al 911 o vaya a la sala de emergencias.  Nmeros de bper  - Dr. Kowalski: [phone]  - Dra. Moye: [phone]  - Dra. Stewart: [phone]  En caso de inclemencias del tiempo, por favor llame a nuestra lnea principal al [phone] para una actualizacin sobre el estado de cualquier retraso o cierre.  Consejos para la medicacin en dermatologa: Por favor, guarde las cajas en las que vienen los medicamentos de uso tpico para ayudarle a seguir las instrucciones sobre dnde y cmo usarlos. Las farmacias generalmente imprimen las instrucciones del medicamento slo en las cajas y no directamente en los tubos del medicamento.   Si su medicamento es muy caro, por favor, pngase en contacto con nuestra oficina llamando al [phone] y presione la opcin 4 o envenos un mensaje a travs de MyChart.   No podemos decirle cul ser su copago por los medicamentos por adelantado ya que esto es diferente dependiendo de la cobertura de su seguro.  Sin embargo, es posible que podamos encontrar un medicamento sustituto a menor costo o llenar un formulario para que el seguro cubra el medicamento que se considera necesario.   Si se requiere una autorizacin previa para que su compaa de seguros cubra su medicamento, por favor permtanos de 1 a 2 das hbiles para completar este proceso.  Los precios de los medicamentos varan con frecuencia dependiendo del lugar de dnde se surte la receta y alguna farmacias pueden ofrecer precios ms baratos.  El sitio web www.goodrx.com tiene cupones para medicamentos de diferentes farmacias. Los precios aqu no tienen en cuenta lo que podra costar con la ayuda del seguro (puede ser ms barato con su seguro), pero el sitio web puede darle el precio si no utiliz ningn seguro.  - Puede imprimir el cupn correspondiente y llevarlo con su receta a la farmacia.  - Tambin puede pasar por nuestra oficina durante el horario de atencin regular y recoger una tarjeta de cupones de GoodRx.  -   Si necesita que su receta se enve electrnicamente a una farmacia diferente, informe a nuestra oficina a travs de MyChart de [place] o por telfono llamando al [phone] y presione la opcin 4.  

## 2022-10-05 NOTE — Progress Notes (Signed)
Follow-Up Visit   Subjective  Diamond Walsh is a 88 y.o. female who presents for the following: ISK f/u (L mid back at braline, L post shoulder, R post axilla, L lower sternum). They have cleared up.  She has a few new spots that are bothersome and itchy.    The following portions of the chart were reviewed this encounter and updated as appropriate:       Review of Systems:  No other skin or systemic complaints except as noted in HPI or Assessment and Plan.  Objective  Well appearing patient in no apparent distress; mood and affect are within normal limits.  A focused examination was performed including back, chest, arms. Relevant physical exam findings are noted in the Assessment and Plan.  L post shoulder x 1, R medial shoulder x 1, R medial cheek x 1, L forearm x 1 (4) Stuck on waxy paps with erythema, Previously txd ISKs are clear  Right upper breast, R neck Firm sub q nodules    Assessment & Plan  Inflamed seborrheic keratosis (4) L post shoulder x 1, R medial shoulder x 1, R medial cheek x 1, L forearm x 1  Symptomatic, irritating, patient would like treated.  Cont TMC 0.1% cr qd/bid aa prn itching, avoid f/g/a  Topical steroids (such as triamcinolone, fluocinolone, fluocinonide, mometasone, clobetasol, halobetasol, betamethasone, hydrocortisone) can cause thinning and lightening of the skin if they are used for too long in the same area. Your physician has selected the right strength medicine for your problem and area affected on the body. Please use your medication only as directed by your physician to prevent side effects.     Destruction of lesion - L post shoulder x 1, R medial shoulder x 1, R medial cheek x 1, L forearm x 1  Destruction method: cryotherapy   Informed consent: discussed and consent obtained   Lesion destroyed using liquid nitrogen: Yes   Region frozen until ice ball extended beyond lesion: Yes   Outcome: patient tolerated procedure well with  no complications   Post-procedure details: wound care instructions given   Additional details:  Prior to procedure, discussed risks of blister formation, small wound, skin dyspigmentation, or rare scar following cryotherapy. Recommend Vaseline ointment to treated areas while healing.   Related Medications triamcinolone (KENALOG) 0.1 % Apply 1 application topically 2 (two) times daily as needed. Avoid face, underarms, groin areas  Epidermal cyst Right upper breast, R neck  Benign-appearing. Exam most consistent with an epidermal inclusion cyst. Discussed that a cyst is a benign growth that can grow over time and sometimes get irritated or inflamed. Recommend observation if it is not bothersome. Discussed option of surgical excision to remove it if it is growing, symptomatic, or other changes noted. Please call for new or changing lesions so they can be evaluated.    Seborrheic Keratoses - Stuck-on, waxy, tan-brown papules and/or plaques  - Benign-appearing - Discussed benign etiology and prognosis. - Observe - Call for any changes  Lentigines - Scattered tan macules - Due to sun exposure - Benign-appearing, observe - Recommend daily broad spectrum sunscreen SPF 30+ to sun-exposed areas, reapply every 2 hours as needed. - Call for any changes   Return in about 6 months (around 04/05/2023) for ISK f/u.  I, Othelia Pulling, RMA, am acting as scribe for Brendolyn Patty, MD .  Documentation: I have reviewed the above documentation for accuracy and completeness, and I agree with the above.  Brendolyn Patty MD

## 2022-10-06 ENCOUNTER — Other Ambulatory Visit: Payer: Self-pay | Admitting: Obstetrics

## 2022-10-06 DIAGNOSIS — R1031 Right lower quadrant pain: Secondary | ICD-10-CM

## 2022-10-23 ENCOUNTER — Other Ambulatory Visit: Payer: Self-pay | Admitting: Obstetrics

## 2022-10-23 ENCOUNTER — Ambulatory Visit
Admission: RE | Admit: 2022-10-23 | Discharge: 2022-10-23 | Disposition: A | Discharge status: Home / Self Care | Payer: Medicare Other | Source: Ambulatory Visit | Attending: Obstetrics | Admitting: Obstetrics

## 2022-10-23 DIAGNOSIS — R1031 Right lower quadrant pain: Secondary | ICD-10-CM

## 2023-04-06 ENCOUNTER — Ambulatory Visit: Payer: Medicare Other | Admitting: Dermatology

## 2023-04-06 VITALS — BP 134/76

## 2023-04-06 DIAGNOSIS — L853 Xerosis cutis: Secondary | ICD-10-CM

## 2023-04-06 DIAGNOSIS — L820 Inflamed seborrheic keratosis: Secondary | ICD-10-CM | POA: Diagnosis not present

## 2023-04-06 DIAGNOSIS — L299 Pruritus, unspecified: Secondary | ICD-10-CM | POA: Diagnosis not present

## 2023-04-06 MED ORDER — MOMETASONE FUROATE 0.1 % EX CREA: 1.0000 | TOPICAL_CREAM | CUTANEOUS | 1 refills | Status: AC

## 2023-04-06 NOTE — Patient Instructions (Addendum)
Recommend OTC Gold Bond Rapid Relief Anti-Itch cream (pramoxine + menthol), CeraVe Anti-itch cream or lotion (pramoxine), Sarna lotion (Original- menthol + camphor or Sensitive- pramoxine) or Eucerin 12 hour Itch Relief lotion (menthol) up to 3 times per day to areas on body that are itchy.   Gentle Skin Care Guide  1. Bathe no more than once a day.  2. Avoid bathing in hot water  3. Use a mild soap like Dove, Vanicream, Cetaphil, CeraVe. Can use Lever 2000 or Cetaphil antibacterial soap  4. Use soap only where you need it. On most days, use it under your arms, between your legs, and on your feet. Let the water rinse other areas unless visibly dirty.  5. When you get out of the bath/shower, use a towel to gently blot your skin dry, don't rub it.  6. While your skin is still a little damp, apply a moisturizing cream such as Vanicream, CeraVe, Cetaphil, Eucerin, Sarna lotion or plain Vaseline Jelly. For hands apply Neutrogena [place] Hand Cream or Excipial Hand Cream.  7. Reapply moisturizer any time you start to itch or feel dry.  8. Sometimes using free and clear laundry detergents can be helpful. Fabric softener sheets should be avoided. Downy Free & Gentle liquid, or any liquid fabric softener that is free of dyes and perfumes, it acceptable to use  9. If your doctor has given you prescription creams you may apply moisturizers over them   Cryotherapy Aftercare  Wash gently with soap and water everyday.   Apply Vaseline and Band-Aid daily until healed.   Due to recent changes in healthcare laws, you may see results of your pathology and/or laboratory studies on MyChart before the doctors have had a chance to review them. We understand that in some cases there may be results that are confusing or concerning to you. Please understand that not all results are received at the same time and often the doctors may need to interpret multiple results in order to provide you with the best plan of  care or course of treatment. Therefore, we ask that you please give [place] 2 business days to thoroughly review all your results before contacting the office for clarification. Should we see a critical lab result, you will be contacted sooner.   If You Need Anything After Your Visit  If you have any questions or concerns for your doctor, please call our main line at [phone] and press option 4 to reach your doctor's medical assistant. If no one answers, please leave a voicemail as directed and we will return your call as soon as possible. Messages left after 4 pm will be answered the following business day.   You may also send [place] a message via MyChart. We typically respond to MyChart messages within 1-2 business days.  For prescription refills, please ask your pharmacy to contact our office. Our fax number is [phone].  If you have an urgent issue when the clinic is closed that cannot wait until the next business day, you can page your doctor at the number below.    Please note that while we do our best to be available for urgent issues outside of office hours, we are not available 24/7.   If you have an urgent issue and are unable to reach [place], you may choose to seek medical care at your doctor's office, retail clinic, urgent care center, or emergency room.  If you have a medical emergency, please immediately call 911 or go to the emergency  department.  Pager Numbers  - Dr. Gwen Pounds: [phone]  - Dr. Roseanne Reno: [phone]  - Dr. Katrinka Blazing: [phone]   In the event of inclement weather, please call our main line at [phone] for an update on the status of any delays or closures.  Dermatology Medication Tips: Please keep the boxes that topical medications come in in order to help keep track of the instructions about where and how to use these. Pharmacies typically print the medication instructions only on the boxes and not directly on the medication tubes.   If your medication  is too expensive, please contact our office at [phone] option 4 or send [place] a message through MyChart.   We are unable to tell what your co-pay for medications will be in advance as this is different depending on your insurance coverage. However, we may be able to find a substitute medication at lower cost or fill out paperwork to get insurance to cover a needed medication.   If a prior authorization is required to get your medication covered by your insurance company, please allow [place] 1-2 business days to complete this process.  Drug prices often vary depending on where the prescription is filled and some pharmacies may offer cheaper prices.  The website www.goodrx.com contains coupons for medications through different pharmacies. The prices here do not account for what the cost may be with help from insurance (it may be cheaper with your insurance), but the website can give you the price if you did not use any insurance.  - You can print the associated coupon and take it with your prescription to the pharmacy.  - You may also stop by our office during regular business hours and pick up a GoodRx coupon card.  - If you need your prescription sent electronically to a different pharmacy, notify our office through [facility] or by phone at [phone] option 4.     Si Usted Necesita Algo Despus de Su Visita  Tambin puede enviarnos un mensaje a travs de Clinical cytogeneticist. Por lo general respondemos a los mensajes de MyChart en el transcurso de 1 a 2 das hbiles.  Para renovar recetas, por favor pida a su farmacia que se ponga en contacto con nuestra oficina. Annie Sable de fax es [place] [phone].  Si tiene un asunto urgente cuando la clnica est cerrada y que no puede esperar hasta el siguiente da hbil, puede llamar/localizar a su doctor(a) al nmero que aparece a continuacin.   Por favor, tenga en cuenta que aunque hacemos todo lo posible para estar disponibles para asuntos  urgentes fuera del horario de [place], no estamos disponibles las 24 horas del da, los 7 [address]  [address] de la [place].   Si tiene un problema urgente y no puede comunicarse con nosotros, puede optar por buscar atencin mdica  en el consultorio de su doctor(a), en una clnica privada, en un centro de atencin urgente o en una sala de emergencias.  Si tiene Engineer, drilling, por favor llame inmediatamente al 911 o vaya a la sala de emergencias.  Nmeros de bper  - Dr. Gwen Pounds: [phone]  - Dra. Roseanne Reno: [phone]  - Dr. Katrinka Blazing: [phone]   En caso de inclemencias del tiempo, por favor llame a Lacy Duverney principal al [phone] para una actualizacin sobre el [place] de cualquier retraso o cierre.  Consejos para la medicacin en dermatologa: Por favor, guarde las cajas en las que vienen los medicamentos de uso tpico para ayudarle a seguir las instrucciones sobre dnde y cmo usarlos.  Las farmacias generalmente imprimen las instrucciones del medicamento slo en las cajas y no directamente en los tubos del [place].   Si su medicamento es muy caro, por favor, pngase en contacto con Rolm Gala llamando al [phone] y presione la opcin 4 o envenos un mensaje a travs de Clinical cytogeneticist.   No podemos decirle cul ser su copago por los medicamentos por adelantado ya que esto es diferente dependiendo de la cobertura de su seguro. Sin embargo, es posible que podamos encontrar un medicamento sustituto a Audiological scientist un formulario para que el seguro cubra el medicamento que se considera necesario.   Si se requiere una autorizacin previa para que su compaa de seguros [place] su medicamento, por favor permtanos de 1 a 2 das hbiles para completar [address]  Los precios de los medicamentos varan con frecuencia dependiendo del Environmental consultant de dnde se surte la receta y alguna farmacias pueden ofrecer precios ms baratos.  El sitio web www.goodrx.com tiene cupones para  medicamentos de Health and safety inspector. Los precios aqu no tienen en cuenta lo que podra costar con la ayuda del seguro (puede ser ms barato con su seguro), pero el sitio web puede darle el precio si no utiliz Tourist information centre manager.  - Puede imprimir el cupn correspondiente y llevarlo con su receta a la farmacia.  - Tambin puede pasar por nuestra oficina durante el horario de atencin regular y Education officer, museum una tarjeta de cupones de GoodRx.  - Si necesita que su receta se enve electrnicamente a una farmacia diferente, informe a nuestra oficina a travs de MyChart de [place] o por telfono llamando al [phone] y presione la opcin 4.

## 2023-04-06 NOTE — Progress Notes (Signed)
   Follow-Up Visit   Subjective  Diamond Walsh is a 88 y.o. female who presents for the following: ISK 57m f/u,  L post shoulder x 1, R medial shoulder x 1, R medial cheek x 1, L forearm x 1, some new areas back, chest, itchy, used TMC 0.1% bid for itch, check neck itchy rash x 2 wks, used polysporin The patient has spots, moles and lesions to be evaluated, some may be new or changing and the patient may have concern these could be cancer.   The following portions of the chart were reviewed this encounter and updated as appropriate: medications, allergies, medical history  Review of Systems:  No other skin or systemic complaints except as noted in HPI or Assessment and Plan.  Objective  Well appearing patient in no apparent distress; mood and affect are within normal limits.  A focused examination was performed of the following areas: Back, chest, neck  Relevant exam findings are noted in the Assessment and Plan.  R mid back x 4. R breast x 1, L inframammary x 3, R inframammary x 3 (11) Stuck on waxy plaques with erythema    Assessment & Plan     Inflamed seborrheic keratosis (11) R mid back x 4. R breast x 1, L inframammary x 3, R inframammary x 3  Symptomatic, irritating, patient would like treated.   Destruction of lesion - R mid back x 4. R breast x 1, L inframammary x 3, R inframammary x 3 (11)  Destruction method: cryotherapy   Informed consent: discussed and consent obtained   Lesion destroyed using liquid nitrogen: Yes   Region frozen until ice ball extended beyond lesion: Yes   Outcome: patient tolerated procedure well with no complications   Post-procedure details: wound care instructions given   Additional details:  Prior to procedure, discussed risks of blister formation, small wound, skin dyspigmentation, or rare scar following cryotherapy. Recommend Vaseline ointment to treated areas while healing.   Related Medications triamcinolone (KENALOG) 0.1 % Apply 1  application topically 2 (two) times daily as needed. 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 Condition is symptomatic/ bothersome to patient. Not currently at goal.   Treatment Plan: Start Mometasone cream qd/bid to aa neck and chest for up to 2 wks then d/c  Recommend OTC Gold Bond Rapid Relief Anti-Itch cream (pramoxine + menthol), CeraVe Anti-itch cream or lotion (pramoxine), Sarna lotion (Original- menthol + camphor or Sensitive- pramoxine) or Eucerin 12 hour Itch Relief lotion (menthol) up to 3 times per day to areas on body that are itchy.   Discussed Dupixent if itching not improving with topicals  Topical steroids (such as triamcinolone, fluocinolone, fluocinonide, mometasone, clobetasol, halobetasol, betamethasone, hydrocortisone) can cause thinning and lightening of the skin if they are used for too long in the same area. Your physician has selected the right strength medicine for your problem and area affected on the body. Please use your medication only as directed by your physician to prevent side effects.    Return in about 4 months (around 08/06/2023) for ISK f/u.  I, Ardis Rowan, RMA, am acting as scribe for Willeen Niece, MD .   Documentation: I have reviewed the above documentation for accuracy and completeness, and I agree with the above.  Willeen Niece, MD

## 2023-05-24 ENCOUNTER — Encounter: Payer: Self-pay | Admitting: Dermatology

## 2023-05-26 ENCOUNTER — Ambulatory Visit: Payer: Medicare Other | Admitting: Dermatology

## 2023-05-26 VITALS — BP 115/65 | HR 66

## 2023-05-26 DIAGNOSIS — L820 Inflamed seborrheic keratosis: Secondary | ICD-10-CM

## 2023-05-26 DIAGNOSIS — L304 Erythema intertrigo: Secondary | ICD-10-CM

## 2023-05-26 DIAGNOSIS — L821 Other seborrheic keratosis: Secondary | ICD-10-CM

## 2023-05-26 DIAGNOSIS — L309 Dermatitis, unspecified: Secondary | ICD-10-CM

## 2023-05-26 MED ORDER — KETOCONAZOLE 2 % EX CREA: TOPICAL_CREAM | CUTANEOUS | 2 refills | Status: AC

## 2023-05-26 MED ORDER — PIMECROLIMUS 1 % EX CREA: TOPICAL_CREAM | CUTANEOUS | 1 refills | Status: AC

## 2023-05-26 MED ORDER — FLUCONAZOLE 150 MG PO TABS: 150.0000 mg | ORAL_TABLET | Freq: Every day | ORAL | 0 refills | Status: AC

## 2023-05-26 NOTE — Patient Instructions (Addendum)
Start fluconazole (Diflucan) 150 MG - take 1 tablet once a day for 5 days. Start Ketoconazole 2% cream once daily to rash in skin folds. Start mometasone cream to rash in skin folds twice a day x 1 week then decrease to once daily, using no longer than 2 weeks. Once finished with mometasone, start pimecrolimus cream to affected areas rash once a day until rash improved.  Topical steroids (such as triamcinolone, fluocinolone, fluocinonide, mometasone, clobetasol, halobetasol, betamethasone, hydrocortisone) can cause thinning and lightening of the skin if they are used for too long in the same area. Your physician has selected the right strength medicine for your problem and area affected on the body. Please use your medication only as directed by your physician to prevent side effects.    Due to recent changes in healthcare laws, you may see results of your pathology and/or laboratory studies on MyChart before the doctors have had a chance to review them. We understand that in some cases there may be results that are confusing or concerning to you. Please understand that not all results are received at the same time and often the doctors may need to interpret multiple results in order to provide you with the best plan of care or course of treatment. Therefore, we ask that you please give [place] 2 business days to thoroughly review all your results before contacting the office for clarification. Should we see a critical lab result, you will be contacted sooner.   If You Need Anything After Your Visit  If you have any questions or concerns for your doctor, please call our main line at [phone] and press option 4 to reach your doctor's medical assistant. If no one answers, please leave a voicemail as directed and we will return your call as soon as possible. Messages left after 4 pm will be answered the following business day.   You may also send [place] a message via MyChart. We typically respond to MyChart  messages within 1-2 business days.  For prescription refills, please ask your pharmacy to contact our office. Our fax number is [phone].  If you have an urgent issue when the clinic is closed that cannot wait until the next business day, you can page your doctor at the number below.    Please note that while we do our best to be available for urgent issues outside of office hours, we are not available 24/7.   If you have an urgent issue and are unable to reach [place], you may choose to seek medical care at your doctor's office, retail clinic, urgent care center, or emergency room.  If you have a medical emergency, please immediately call 911 or go to the emergency department.  Pager Numbers  - Dr. Gwen Pounds: [phone]  - Dr. Roseanne Reno: [phone]  - Dr. Katrinka Blazing: [phone]   In the event of inclement weather, please call our main line at [phone] for an update on the status of any delays or closures.  Dermatology Medication Tips: Please keep the boxes that topical medications come in in order to help keep track of the instructions about where and how to use these. Pharmacies typically print the medication instructions only on the boxes and not directly on the medication tubes.   If your medication is too expensive, please contact our office at [phone] option 4 or send [place] a message through MyChart.   We are unable to tell what your co-pay for medications will be in advance as this is different depending on  your insurance coverage. However, we may be able to find a substitute medication at lower cost or fill out paperwork to get insurance to cover a needed medication.   If a prior authorization is required to get your medication covered by your insurance company, please allow [place] 1-2 business days to complete this process.  Drug prices often vary depending on where the prescription is filled and some pharmacies may offer cheaper prices.  The website www.goodrx.com contains  coupons for medications through different pharmacies. The prices here do not account for what the cost may be with help from insurance (it may be cheaper with your insurance), but the website can give you the price if you did not use any insurance.  - You can print the associated coupon and take it with your prescription to the pharmacy.  - You may also stop by our office during regular business hours and pick up a GoodRx coupon card.  - If you need your prescription sent electronically to a different pharmacy, notify our office through [facility] or by phone at [phone] option 4.     Si Usted Necesita Algo Despus de Su Visita  Tambin puede enviarnos un mensaje a travs de Clinical cytogeneticist. Por lo general respondemos a los mensajes de MyChart en el transcurso de 1 a 2 das hbiles.  Para renovar recetas, por favor pida a su farmacia que se ponga en contacto con nuestra oficina. Annie Sable de fax es [place] [phone].  Si tiene un asunto urgente cuando la clnica est cerrada y que no puede esperar hasta el siguiente da hbil, puede llamar/localizar a su doctor(a) al nmero que aparece a continuacin.   Por favor, tenga en cuenta que aunque hacemos todo lo posible para estar disponibles para asuntos urgentes fuera del horario de [employer], no estamos disponibles las 24 horas del da, los 7 [address]  [address] de la [place].   Si tiene un problema urgente y no puede comunicarse con nosotros, puede optar por buscar atencin mdica  en el consultorio de su doctor(a), en una clnica privada, en un centro de atencin urgente o en una sala de emergencias.  Si tiene Engineer, drilling, por favor llame inmediatamente al 911 o vaya a la sala de emergencias.  Nmeros de bper  - Dr. Gwen Pounds: [phone]  - Dra. Roseanne Reno: [phone]  - Dr. Katrinka Blazing: [phone]   En caso de inclemencias del tiempo, por favor llame a Lacy Duverney principal al [phone] para una actualizacin sobre el [place] de  cualquier retraso o cierre.  Consejos para la medicacin en dermatologa: Por favor, guarde las cajas en las que vienen los medicamentos de uso tpico para ayudarle a seguir las instrucciones sobre dnde y cmo usarlos. Las farmacias generalmente imprimen las instrucciones del medicamento slo en las cajas y no directamente en los tubos del [place].   Si su medicamento es muy caro, por favor, pngase en contacto con Rolm Gala llamando al [phone] y presione la opcin 4 o envenos un mensaje a travs de Clinical cytogeneticist.   No podemos decirle cul ser su copago por los medicamentos por adelantado ya que esto es diferente dependiendo de la cobertura de su seguro. Sin embargo, es posible que podamos encontrar un medicamento sustituto a Audiological scientist un formulario para que el seguro cubra el medicamento que se considera necesario.   Si se requiere una autorizacin previa para que su compaa de seguros [place] su medicamento, por favor permtanos de 1 a 2 das hbiles para completar [address]  Los precios de los medicamentos varan con frecuencia dependiendo del Environmental consultant de dnde se surte la receta y alguna farmacias pueden ofrecer precios ms baratos.  El sitio web www.goodrx.com tiene cupones para medicamentos de Health and safety inspector. Los precios aqu no tienen en cuenta lo que podra costar con la ayuda del seguro (puede ser ms barato con su seguro), pero el sitio web puede darle el precio si no utiliz Tourist information centre manager.  - Puede imprimir el cupn correspondiente y llevarlo con su receta a la farmacia.  - Tambin puede pasar por nuestra oficina durante el horario de atencin regular y Education officer, museum una tarjeta de cupones de GoodRx.  - Si necesita que su receta se enve electrnicamente a una farmacia diferente, informe a nuestra oficina a travs de MyChart de [place] o por telfono llamando al [phone] y presione la opcin 4.

## 2023-05-26 NOTE — Progress Notes (Addendum)
Follow Up Visit   Subjective  Diamond Walsh is a 88 y.o. female who presents for the following: Rash/dermatitis of the chest and neck, spread to antecubital, inframammary, and inguinal creases about a month ago. She has used calamine lotion to these areas. Itching keeps her up at night, "unbearable itching". Patient has a history of eczema as a child. Also ISK on back didn't clear up with freezing last visit.    The following portions of the chart were reviewed this encounter and updated as appropriate: medications, allergies, medical history  Review of Systems:  No other skin or systemic complaints except as noted in HPI or Assessment and Plan.  Objective  Well appearing patient in no apparent distress; mood and affect are within normal limits.  A focused examination was performed of the following areas: Face, trunk, extremities  Relevant exam findings are noted in the Assessment and Plan.  Right Mid Back Residual erythematous stuck-on, waxy plaque    Assessment & Plan   Inflamed seborrheic keratosis Right Mid Back  Residual. Symptomatic, irritating, patient would like treated.  Destruction of lesion - Right Mid Back  Destruction method: cryotherapy   Informed consent: discussed and consent obtained   Lesion destroyed using liquid nitrogen: Yes   Region frozen until ice ball extended beyond lesion: Yes   Outcome: patient tolerated procedure well with no complications   Post-procedure details: wound care instructions given   Additional details:  Prior to procedure, discussed risks of blister formation, small wound, skin dyspigmentation, or rare scar following cryotherapy. Recommend Vaseline ointment to treated areas while healing.   Related Medications triamcinolone (KENALOG) 0.1 % Apply 1 application topically 2 (two) times daily as needed. Avoid face, underarms, groin areas   RASH  Dermatitis and Intertrigo with Inflamed Seborrheic Keratoses Exam: erythematous  patches with satellite papules of the inframammary; inguinal groin bilaterally; with associated multiple waxy stuck-on papules/plaques; mild erythema of the anterior neck  Chronic and persistent condition with duration or expected duration over one year. Condition is bothersome/symptomatic for patient. Currently flared.   Treatment Plan: Start Diflucan 150 MG take 1 po every day x 5 days Start ketoconazole 2% cream Apply to affected areas rash once daily. Start mometasone cream twice daily x 1 week, then decrease to once daily x 1 week. Discontinue when itch improves, using no longer than 2 weeks. Caution skin atrophy with long-term use. Pt has at home. Start pimecrolimus cream (start after finished with mometasone cream) apply once to twice daily to affected areas rash until improved.   Side effects of fluconazole (diflucan) include nausea, diarrhea, headache, dizziness, taste changes, rare risk of irritation of the liver, allergy, or decreased blood counts (which could show up as infection or tiredness).   Topical steroids (such as triamcinolone, fluocinolone, fluocinonide, mometasone, clobetasol, halobetasol, betamethasone, hydrocortisone) can cause thinning and lightening of the skin if they are used for too long in the same area. Your physician has selected the right strength medicine for your problem and area affected on the body. Please use your medication only as directed by your physician to prevent side effects.    SEBORRHEIC KERATOSIS - Stuck-on, waxy, tan-brown papules and/or plaques  - Benign-appearing - Discussed benign etiology and prognosis. - Observe - Call for any changes   Return as scheduled. Sooner if not improved.  Wendee Beavers, CMA, am acting as scribe for Willeen Niece, MD .   Documentation: I have reviewed the above documentation for accuracy and completeness, and I  agree with the above.  Willeen Niece, MD

## 2023-08-31 ENCOUNTER — Ambulatory Visit: Payer: Medicare Other | Admitting: Dermatology

## 2023-08-31 DIAGNOSIS — L821 Other seborrheic keratosis: Secondary | ICD-10-CM | POA: Diagnosis not present

## 2023-08-31 DIAGNOSIS — R21 Rash and other nonspecific skin eruption: Secondary | ICD-10-CM | POA: Diagnosis not present

## 2023-08-31 DIAGNOSIS — L820 Inflamed seborrheic keratosis: Secondary | ICD-10-CM | POA: Diagnosis not present

## 2023-08-31 DIAGNOSIS — L309 Dermatitis, unspecified: Secondary | ICD-10-CM

## 2023-08-31 DIAGNOSIS — L304 Erythema intertrigo: Secondary | ICD-10-CM

## 2023-08-31 NOTE — Progress Notes (Signed)
 Follow Up Visit   Subjective  Diamond Walsh is a 89 y.o. female who presents for the following: Intertrigo and Dermatitis with inflamed seborrheic keratoses of the inframammary and inguinal groin. Areas are improved after taking Diflucan  x 5 days, using mometasone  cream (off now), and ketoconazole  2% cream. She has a bothersome spot on her back, very itchy. Also neck and face.   The following portions of the chart were reviewed this encounter and updated as appropriate: medications, allergies, medical history  Review of Systems:  No other skin or systemic complaints except as noted in HPI or Assessment and Plan.  Objective  Well appearing patient in no apparent distress; mood and affect are within normal limits.  A focused examination was performed of the following areas: Face, trunk  Relevant exam findings are noted in the Assessment and Plan.  anterior neck x 10, R mid back x 1 (3rd treatment), R jaw x 1, L jaw x 1, L neck x 1, L inf chin x 1, R lat eye x 1 (16) Erythematous stuck-on, waxy papule or plaque  Assessment & Plan   INFLAMED SEBORRHEIC KERATOSIS (16) anterior neck x 10, R mid back x 1 (3rd treatment), R jaw x 1, L jaw x 1, L neck x 1, L inf chin x 1, R lat eye x 1 (16) Symptomatic, irritating, patient would like treated. Destruction of lesion - anterior neck x 10, R mid back x 1 (3rd treatment), R jaw x 1, L jaw x 1, L neck x 1, L inf chin x 1, R lat eye x 1 (16)  Destruction method: cryotherapy   Informed consent: discussed and consent obtained   Lesion destroyed using liquid nitrogen: Yes   Region frozen until ice ball extended beyond lesion: Yes   Outcome: patient tolerated procedure well with no complications   Post-procedure details: wound care instructions given   Additional details:  Prior to procedure, discussed risks of blister formation, small wound, skin dyspigmentation, or rare scar following cryotherapy. Recommend Vaseline ointment to treated areas  while healing.  Related Medications triamcinolone  (KENALOG ) 0.1 % Apply 1 application topically 2 (two) times daily as needed. Avoid face, underarms, groin areas Rash  Intertrigo, resolved  Exam: Inframammary and inguinal clear  Intertrigo is a chronic recurrent rash that occurs in skin fold areas that may be associated with friction; heat; moisture; yeast; fungus; and bacteria.  It is exacerbated by increased movement / activity; sweating; and higher atmospheric temperature.  Use of an absorbant powder such as Zeasorb AF powder or other OTC antifungal powder to the area daily can prevent rash recurrence. Other options to help keep the area dry include blow drying the area after bathing or using antiperspirant products such as Duradry sweat minimizing gel.  Chronic condition with duration or expected duration over one year. Currently well-controlled.   Treatment Plan: Continue ketoconazole  2% cream to inframammary and inguinal folds as needed for rash.  Pimecrolimus  cream - Apply to affected areas rash on body/skin folds once or twice a day until improved.   SEBORRHEIC KERATOSIS - Stuck-on, waxy, tan-brown papules and/or plaques  - Benign-appearing - Discussed benign etiology and prognosis. - Observe - Call for any changes  Dermatitis Atopic vs Nummular Exam: Pink scaly patch on the right flank.  Chronic and persistent condition with duration or expected duration over one year. Condition is symptomatic/ bothersome to patient. Not currently at goal.   Treatment Plan: Pimecrolimus  cream - Apply to affected areas rash on  body/skin folds once or twice a day until improved.  Mometasone  cream - Apply to affected areas rash on body once or twice a day until improved. Avoid skin folds, face, groin. Avoid applying to face, groin, and axilla. Use as directed. Long-term use can cause thinning of the skin.   Return in about 6 months (around 02/28/2024) for ISKs.  IAndrea Kerns, CMA, am  acting as scribe for Rexene Rattler, MD .   Documentation: I have reviewed the above documentation for accuracy and completeness, and I agree with the above.  Rexene Rattler, MD

## 2023-08-31 NOTE — Patient Instructions (Addendum)
 Pimecrolimus  cream - Apply to affected areas rash on body/skin folds once or twice a day until improved.   Mometasone  cream - Apply to affected areas rash on body once or twice a day until improved. Avoid skin folds, face, groin. Avoid applying to face, groin, and axilla. Use as directed. Long-term use can cause thinning of the skin.  Continue Ketoconazole  2% cream to rash under breasts once a day as needed, twice a day with flares .  Intertrigo is a chronic recurrent rash that occurs in skin fold areas that may be associated with friction; heat; moisture; yeast; fungus; and bacteria.  It is exacerbated by increased movement / activity; sweating; and higher atmospheric temperature.  Use of an absorbant powder such as Zeasorb AF powder or other OTC antifungal powder to the area daily can prevent rash recurrence. Other options to help keep the area dry include blow drying the area after bathing or using antiperspirant products such as Duradry sweat minimizing gel.   Due to recent changes in healthcare laws, you may see results of your pathology and/or laboratory studies on MyChart before the doctors have had a chance to review them. We understand that in some cases there may be results that are confusing or concerning to you. Please understand that not all results are received at the same time and often the doctors may need to interpret multiple results in order to provide you with the best plan of care or course of treatment. Therefore, we ask that you please give us  2 business days to thoroughly review all your results before contacting the office for clarification. Should we see a critical lab result, you will be contacted sooner.   If You Need Anything After Your Visit  If you have any questions or concerns for your doctor, please call our main line at [phone] and press option 4 to reach your doctor's medical assistant. If no one answers, please leave a voicemail as directed and we will return  your call as soon as possible. Messages left after 4 pm will be answered the following business day.   You may also send us  a message via MyChart. We typically respond to MyChart messages within 1-2 business days.  For prescription refills, please ask your pharmacy to contact our office. Our fax number is [phone].  If you have an urgent issue when the clinic is closed that cannot wait until the next business day, you can page your doctor at the number below.    Please note that while we do our best to be available for urgent issues outside of office hours, we are not available 24/7.   If you have an urgent issue and are unable to reach us , you may choose to seek medical care at your doctor's office, retail clinic, urgent care center, or emergency room.  If you have a medical emergency, please immediately call 911 or go to the emergency department.  Pager Numbers  - Dr. Hester: [phone]  - Dr. Jackquline: [phone]  - Dr. Claudene: [phone]   In the event of inclement weather, please call our main line at [phone] for an update on the status of any delays or closures.  Dermatology Medication Tips: Please keep the boxes that topical medications come in in order to help keep track of the instructions about where and how to use these. Pharmacies typically print the medication instructions only on the boxes and not directly on the medication tubes.   If your medication is too expensive, please  contact our office at [phone] option 4 or send us  a message through MyChart.   We are unable to tell what your co-pay for medications will be in advance as this is different depending on your insurance coverage. However, we may be able to find a substitute medication at lower cost or fill out paperwork to get insurance to cover a needed medication.   If a prior authorization is required to get your medication covered by your insurance company, please allow us  1-2 business days to  complete this process.  Drug prices often vary depending on where the prescription is filled and some pharmacies may offer cheaper prices.  The website www.goodrx.com contains coupons for medications through different pharmacies. The prices here do not account for what the cost may be with help from insurance (it may be cheaper with your insurance), but the website can give you the price if you did not use any insurance.  - You can print the associated coupon and take it with your prescription to the pharmacy.  - You may also stop by our office during regular business hours and pick up a GoodRx coupon card.  - If you need your prescription sent electronically to a different pharmacy, notify our office through [facility] or by phone at [phone] option 4.     Si Usted Necesita Algo Despus de Su Visita  Tambin puede enviarnos un mensaje a travs de Clinical Cytogeneticist. Por lo general respondemos a los mensajes de MyChart en el transcurso de 1 a 2 das hbiles.  Para renovar recetas, por favor pida a su farmacia que se ponga en contacto con nuestra oficina. Randi lakes de fax es [place] [phone].  Si tiene un asunto urgente cuando la clnica est cerrada y que no puede esperar hasta el siguiente da hbil, puede llamar/localizar a su doctor(a) al nmero que aparece a continuacin.   Por favor, tenga en cuenta que aunque hacemos todo lo posible para estar disponibles para asuntos urgentes fuera del horario de [place], no estamos disponibles las 24 horas del da, los 7 [address]  [address] de la [place].   Si tiene un problema urgente y no puede comunicarse con nosotros, puede optar por buscar atencin mdica  en el consultorio de su doctor(a), en una clnica privada, en un centro de atencin urgente o en una sala de emergencias.  Si tiene engineer, drilling, por favor llame inmediatamente al 911 o vaya a la sala de emergencias.  Nmeros de bper  - Dr. Hester: [phone]  - Dra. Jackquline:  [phone]  - Dr. Claudene: [phone]   En caso de inclemencias del tiempo, por favor llame a landry capes principal al [phone] para una actualizacin sobre el [place] de cualquier retraso o cierre.  Consejos para la medicacin en dermatologa: Por favor, guarde las cajas en las que vienen los medicamentos de uso tpico para ayudarle a seguir las instrucciones sobre dnde y cmo usarlos. Las farmacias generalmente imprimen las instrucciones del medicamento slo en las cajas y no directamente en los tubos del [place].   Si su medicamento es muy caro, por favor, pngase en contacto con landry rieger llamando al [phone] y presione la opcin 4 o envenos un mensaje a travs de Clinical Cytogeneticist.   No podemos decirle cul ser su copago por los medicamentos por adelantado ya que esto es diferente dependiendo de la cobertura de su seguro. Sin embargo, es posible que podamos encontrar un medicamento sustituto a audiological scientist un formulario para que el seguro cubra  el medicamento que se considera necesario.   Si se requiere una autorizacin previa para que su compaa de seguros cubra su medicamento, por favor permtanos de 1 a 2 das hbiles para completar este proceso.  Los precios de los medicamentos varan con frecuencia dependiendo del environmental consultant de dnde se surte la receta y alguna farmacias pueden ofrecer precios ms baratos.  El sitio web www.goodrx.com tiene cupones para medicamentos de health and safety inspector. Los precios aqu no tienen en cuenta lo que podra costar con la ayuda del seguro (puede ser ms barato con su seguro), pero el sitio web puede darle el precio si no utiliz tourist information centre manager.  - Puede imprimir el cupn correspondiente y llevarlo con su receta a la farmacia.  - Tambin puede pasar por nuestra oficina durante el horario de atencin regular y education officer, museum una tarjeta de cupones de GoodRx.  - Si necesita que su receta se enve electrnicamente a una farmacia diferente, informe  a nuestra oficina a travs de MyChart de [place] o por telfono llamando al [phone] y presione la opcin 4.

## 2023-12-16 ENCOUNTER — Emergency Department
Admission: EM | Admit: 2023-12-16 | Discharge: 2023-12-16 | Disposition: A | Discharge status: Home / Self Care | Attending: Emergency Medicine | Admitting: Emergency Medicine

## 2023-12-16 ENCOUNTER — Emergency Department

## 2023-12-16 ENCOUNTER — Other Ambulatory Visit: Payer: Self-pay

## 2023-12-16 DIAGNOSIS — S0990XA Unspecified injury of head, initial encounter: Secondary | ICD-10-CM | POA: Diagnosis present

## 2023-12-16 DIAGNOSIS — W010XXA Fall on same level from slipping, tripping and stumbling without subsequent striking against object, initial encounter: Secondary | ICD-10-CM | POA: Insufficient documentation

## 2023-12-16 DIAGNOSIS — I10 Essential (primary) hypertension: Secondary | ICD-10-CM | POA: Insufficient documentation

## 2023-12-16 DIAGNOSIS — S060X1A Concussion with loss of consciousness of 30 minutes or less, initial encounter: Secondary | ICD-10-CM

## 2023-12-16 DIAGNOSIS — S79911A Unspecified injury of right hip, initial encounter: Secondary | ICD-10-CM | POA: Diagnosis not present

## 2023-12-16 DIAGNOSIS — S8991XA Unspecified injury of right lower leg, initial encounter: Secondary | ICD-10-CM

## 2023-12-16 DIAGNOSIS — W19XXXA Unspecified fall, initial encounter: Secondary | ICD-10-CM

## 2023-12-16 LAB — CBC
HCT: 43.5 % (ref 36.0–46.0)
Hemoglobin: 14 g/dL (ref 12.0–15.0)
MCH: 27.9 pg (ref 26.0–34.0)
MCHC: 32.2 g/dL (ref 30.0–36.0)
MCV: 86.7 fL (ref 80.0–100.0)
Platelets: 239 10*3/uL (ref 150–400)
RBC: 5.02 MIL/uL (ref 3.87–5.11)
RDW: 13.5 % (ref 11.5–15.5)
WBC: 6.5 10*3/uL (ref 4.0–10.5)
nRBC: 0 % (ref 0.0–0.2)

## 2023-12-16 LAB — COMPREHENSIVE METABOLIC PANEL WITH GFR
ALT: 13 U/L (ref 0–44)
AST: 21 U/L (ref 15–41)
Albumin: 3.9 g/dL (ref 3.5–5.0)
Alkaline Phosphatase: 75 U/L (ref 38–126)
Anion gap: 10 (ref 5–15)
BUN: 16 mg/dL (ref 8–23)
CO2: 26 mmol/L (ref 22–32)
Calcium: 9.5 mg/dL (ref 8.9–10.3)
Chloride: 102 mmol/L (ref 98–111)
Creatinine, Ser: 0.56 mg/dL (ref 0.44–1.00)
GFR, Estimated: >60 mL/min (ref >60)
Glucose, Bld: 98 mg/dL (ref 70–99)
Potassium: 3.9 mmol/L (ref 3.5–5.1)
Sodium: 138 mmol/L (ref 135–145)
Total Bilirubin: 0.3 mg/dL (ref 0.0–1.2)
Total Protein: 7.6 g/dL (ref 6.5–8.1)

## 2023-12-16 NOTE — ED Triage Notes (Signed)
 Pt arrived via EMS. Per EMS pt was found in the road by a neighbor due to a mechanical fall. Pt is not taking any blood thinners. Pt sts that she is unsure if she had any LOC. Pt did hit her head. Pt was ambulatory on scene. Pt is having right hip and knee pain. Pt ambulatory from EMS stretcher to ED stretcher.

## 2023-12-16 NOTE — ED Provider Notes (Signed)
 [facility] Provider Note   Event Date/Time   First MD Initiated Contact with Patient 12/16/23 [phone]     (approximate) History  Fall  HPI Diamond Walsh is a 89 y.o. female with a past medical history of hypertension, hyperlipidemia, paroxysmal atrial fibrillation, and peripheral vascular disease who presents complaining of a mechanical fall from standing after she tripped over a manhole cover on her daily walk this morning.  Patient states that she landed on the right side striking her right hip and side of her head with brief loss of consciousness.  Patient has not had any subsequent loss of consciousness or nausea/vomiting.  Patient denies any palpitations, chest pain, nausea, or lightheadedness prior to this fall.  Patient does complain of right hip and right knee pain as well as pain to the right side of the head. ROS: Patient currently denies any vision changes, tinnitus, difficulty speaking, facial droop, sore throat, chest pain, shortness of breath, abdominal pain, nausea/vomiting/diarrhea, dysuria, or weakness/numbness/paresthesias in any extremity   Physical Exam  Triage Vital Signs: ED Triage Vitals [12/16/23 0838]  Encounter Vitals Group     BP      Systolic BP Percentile      Diastolic BP Percentile      Pulse      Resp      Temp      Temp src      SpO2      Weight 156 lb (70.8 kg)     Height      Head Circumference      Peak Flow      Pain Score 3     Pain Loc      Pain Education      Exclude from Growth Chart    Most recent vital signs: There were no vitals filed for this visit. General: Awake, oriented x4. CV:  Good peripheral perfusion.  Resp:  Normal effort.  Abd:  No distention.  Other:  Elderly overweight Caucasian female resting comfortably in no acute distress.  No lower extremity shortening.  Mild tenderness to palpation over the right lateral hip.  No palpable scalp hematoma or contusion ED Results / Procedures / Treatments   Labs (all labs ordered are listed, but only abnormal results are displayed) Labs Reviewed  CBC  COMPREHENSIVE METABOLIC PANEL WITH GFR   EKG ED ECG REPORT I, Charleen Conn, the attending physician, personally viewed and interpreted this ECG. Date: 12/16/2023 EKG Time: 0848 Rate: 74 Rhythm: normal sinus rhythm QRS Axis: normal Intervals: normal ST/T Wave abnormalities: normal Narrative Interpretation: Normal sinus rhythm with PACs. no evidence of acute ischemia RADIOLOGY ED MD interpretation: X-ray of the right hip and knee with pelvis independently interpreted and shows no evidence of acute abnormalities CT of the head without contrast interpreted by me shows no evidence of acute abnormalities including no intracerebral hemorrhage, obvious masses, or significant edema CT of the cervical spine interpreted by me does not show any evidence of acute abnormalities including no acute fracture, malalignment, height loss, or dislocation -Agree with radiology assessment Official radiology report(s): DG Hip Unilat W or Wo Pelvis 2-3 Views Right Result Date: 12/16/2023 CLINICAL DATA:  Fall and right hip pain. EXAM: DG HIP (WITH OR WITHOUT PELVIS) 2-3V RIGHT COMPARISON:  None Available. FINDINGS: There is no acute fracture or dislocation. The bones are osteopenic. Mild arthritic changes of the right hip. Vascular calcification. The soft tissues are unremarkable. IMPRESSION: 1. No acute fracture or dislocation. 2.  Mild arthritic changes of the right hip. Electronically Signed   By: Angus Bark M.D.   On: 12/16/2023 10:13   DG Knee 2 Views Right Result Date: 12/16/2023 CLINICAL DATA:  Right knee pain after unwitnessed fall. EXAM: RIGHT KNEE - 1-2 VIEW COMPARISON:  None Available. FINDINGS: No evidence of fracture, dislocation, or joint effusion. No evidence of arthropathy or other focal bone abnormality. Soft tissues are unremarkable. IMPRESSION: Negative. Electronically Signed   By: Rosalene Colon M.D.   On: 12/16/2023 10:07   CT Head Wo Contrast Result Date: 12/16/2023 CLINICAL DATA:  Fall and head trauma. EXAM: CT HEAD WITHOUT CONTRAST CT CERVICAL SPINE WITHOUT CONTRAST TECHNIQUE: Multidetector CT imaging of the head and cervical spine was performed following the standard protocol without intravenous contrast. Multiplanar CT image reconstructions of the cervical spine were also generated. RADIATION DOSE REDUCTION: This exam was performed according to the departmental dose-optimization program which includes automated exposure control, adjustment of the mA and/or kV according to patient size and/or use of iterative reconstruction technique. COMPARISON:  None Available. FINDINGS: CT HEAD FINDINGS Brain: Moderate age-related atrophy and chronic microvascular ischemic changes. There is no acute intracranial hemorrhage. No mass effect or midline shift. No extra-axial fluid collection. Vascular: No hyperdense vessel or unexpected calcification. Skull: Normal. Negative for fracture or focal lesion. Sinuses/Orbits: No acute finding. Other: None CT CERVICAL SPINE FINDINGS Alignment: No acute subluxation. There is reversal of normal cervical lordosis which may be positional or due to muscle spasm. Skull base and vertebrae: No acute fracture. Soft tissues and spinal canal: No prevertebral fluid or swelling. No visible canal hematoma. Disc levels:  No acute findings.  Degenerative changes. Upper chest: Negative. Other: None IMPRESSION: 1. No acute intracranial pathology. 2. No acute/traumatic cervical spine pathology. No acute intracranial pathology. Moderate age-related atrophy and chronic microvascular ischemic changes. Electronically Signed   By: Angus Bark M.D.   On: 12/16/2023 09:45   CT Cervical Spine Wo Contrast Result Date: 12/16/2023 CLINICAL DATA:  Fall and head trauma. EXAM: CT HEAD WITHOUT CONTRAST CT CERVICAL SPINE WITHOUT CONTRAST TECHNIQUE: Multidetector CT imaging of the head and  cervical spine was performed following the standard protocol without intravenous contrast. Multiplanar CT image reconstructions of the cervical spine were also generated. RADIATION DOSE REDUCTION: This exam was performed according to the departmental dose-optimization program which includes automated exposure control, adjustment of the mA and/or kV according to patient size and/or use of iterative reconstruction technique. COMPARISON:  None Available. FINDINGS: CT HEAD FINDINGS Brain: Moderate age-related atrophy and chronic microvascular ischemic changes. There is no acute intracranial hemorrhage. No mass effect or midline shift. No extra-axial fluid collection. Vascular: No hyperdense vessel or unexpected calcification. Skull: Normal. Negative for fracture or focal lesion. Sinuses/Orbits: No acute finding. Other: None CT CERVICAL SPINE FINDINGS Alignment: No acute subluxation. There is reversal of normal cervical lordosis which may be positional or due to muscle spasm. Skull base and vertebrae: No acute fracture. Soft tissues and spinal canal: No prevertebral fluid or swelling. No visible canal hematoma. Disc levels:  No acute findings.  Degenerative changes. Upper chest: Negative. Other: None IMPRESSION: 1. No acute intracranial pathology. 2. No acute/traumatic cervical spine pathology. No acute intracranial pathology. Moderate age-related atrophy and chronic microvascular ischemic changes. Electronically Signed   By: Angus Bark M.D.   On: 12/16/2023 09:45   PROCEDURES: Critical Care performed: No Procedures MEDICATIONS ORDERED IN ED: Medications - No data to display IMPRESSION / MDM / ASSESSMENT AND PLAN /  ED COURSE  I reviewed the triage vital signs and the nursing notes.                             The patient is on the cardiac monitor to evaluate for evidence of arrhythmia and/or significant heart rate changes. Patient's presentation is most consistent with acute presentation with potential  threat to life or bodily function. Presenting after a fall that occurred just prior to arrival, resulting in injury to the left hip and knee. The mechanism of injury was a mechanical ground level fall without syncope or near-syncope. The current level of pain is moderate. There was no loss of consciousness, confusion, seizure, or memory impairment. There is not a laceration associated with the injury. Denies neck pain. The patient does not take blood thinner medications. Denies vomiting, numbness/weakness, fever  Dispo: Discharge with PCP follow-up     FINAL CLINICAL IMPRESSION(S) / ED DIAGNOSES   Final diagnoses:  Fall, initial encounter  Injury of head, initial encounter  Hip injury, right, initial encounter  Injury of right knee, initial encounter  Concussion with loss of consciousness of 30 minutes or less, initial encounter   Rx / DC Orders   ED Discharge Orders     None      Note:  This document was prepared using Dragon voice recognition software and may include unintentional dictation errors.   Zackrey Dyar K, MD 12/16/23 1452

## 2024-01-25 ENCOUNTER — Encounter: Payer: Self-pay | Admitting: Dermatology

## 2024-01-26 ENCOUNTER — Ambulatory Visit: Admitting: Dermatology

## 2024-01-26 DIAGNOSIS — L820 Inflamed seborrheic keratosis: Secondary | ICD-10-CM | POA: Diagnosis not present

## 2024-01-26 DIAGNOSIS — L853 Xerosis cutis: Secondary | ICD-10-CM

## 2024-01-26 DIAGNOSIS — L821 Other seborrheic keratosis: Secondary | ICD-10-CM

## 2024-01-26 DIAGNOSIS — L299 Pruritus, unspecified: Secondary | ICD-10-CM

## 2024-01-26 MED ORDER — CLOBETASOL PROPIONATE 0.05 % EX SOLN: 1.0000 | CUTANEOUS | 1 refills | Status: DC

## 2024-01-26 MED ORDER — KETOCONAZOLE 2 % EX SHAM: 1.0000 | MEDICATED_SHAMPOO | CUTANEOUS | 2 refills | Status: AC

## 2024-01-26 MED ORDER — FLUOCINONIDE 0.05 % EX SOLN: 1.0000 | CUTANEOUS | 1 refills | Status: AC

## 2024-01-26 NOTE — Progress Notes (Signed)
   Follow-Up Visit   Subjective  Diamond Walsh is a 89 y.o. female who presents for the following: Itchy rash 2wks driving her crazy, lower legs, arms, trunk, scalp, Pt has had itching in past and she used a prescription cream we have given her for that but not sure which one, we have given her TMC 0.1% cr, Mometasone  cr, Elidel  cr, which doesn't help. The patient has spots, moles and lesions to be evaluated, some may be new or changing and the patient may have concern these could be cancer.   The following portions of the chart were reviewed this encounter and updated as appropriate: medications, allergies, medical history  Review of Systems:  No other skin or systemic complaints except as noted in HPI or Assessment and Plan.  Objective  Well appearing patient in no apparent distress; mood and affect are within normal limits.   A focused examination was performed of the following areas: Trunk, arms, lower legs, scalp  Relevant exam findings are noted in the Assessment and Plan.  mid back x 14, R breast x 1, L forearm  x 1 (16) Stuck on waxy paps with erythema  Assessment & Plan   XEROSIS with PRURITUS  Trunk, arms, legs, scalp Exam: scattered pink excoriated papules trunk, very itchy  Chronic and persistent condition with duration or expected duration over one year. Condition is symptomatic/ bothersome to patient. Not currently at goal.   Treatment Plan: Start Clobetasol/Cerave mix bid up to 4 weeks to aa itchy rash trunk, arms, legs, avoid f/g/a Start Fluocinonide solution 1-2 gtts to aa scalp qd/bid prn itch Start Ketoconazole  2% shampoo 3x/wk to wash scalp, let sit 5 minutes and rinse off Recommend mild soap  Topical steroids (such as triamcinolone , fluocinolone, fluocinonide, mometasone , clobetasol, halobetasol, betamethasone, hydrocortisone) can cause thinning and lightening of the skin if they are used for too long in the same area. Your physician has selected the right  strength medicine for your problem and area affected on the body. Please use your medication only as directed by your physician to prevent side effects.   SEBORRHEIC KERATOSIS Scalp, trunk - Stuck-on, waxy, tan-brown papules and/or plaques  - Benign-appearing - Discussed benign etiology and prognosis. - Observe - Call for any changes INFLAMED SEBORRHEIC KERATOSIS (16) mid back x 14, R breast x 1, L forearm  x 1 (16) Symptomatic, irritating, itchy, patient would like treated.   Destruction of lesion - mid back x 14, R breast x 1, L forearm  x 1 (16)  Destruction method: cryotherapy   Informed consent: discussed and consent obtained   Lesion destroyed using liquid nitrogen: Yes   Region frozen until ice ball extended beyond lesion: Yes   Outcome: patient tolerated procedure well with no complications   Post-procedure details: wound care instructions given   Additional details:  Prior to procedure, discussed risks of blister formation, small wound, skin dyspigmentation, or rare scar following cryotherapy. Recommend Vaseline ointment to treated areas while healing.   Return for as scheduled.  I, Rollie Clipper, RMA, am acting as scribe for Artemio Larry, MD .   Documentation: I have reviewed the above documentation for accuracy and completeness, and I agree with the above.  Artemio Larry, MD

## 2024-01-26 NOTE — Patient Instructions (Addendum)
 Eczema Skin Care for arms, legs, chest, back for itching  Buy TWO 16oz jars of CeraVe moisturizing cream  [facility], [facility], [facility] (no prescription needed)  Costs about $15 per jar   Jar #1: Use as a moisturizer as needed. Can be applied to arms, legs, chest, back. Use twice daily to unaffected areas.  Avoid using on face, under arms and in groin.  Jar #2: Pour one 50ml bottle of clobetasol 0.05% solution into jar, mix well. Label this jar to indicate the medication has been added. Use twice daily to affected areas. Do not apply to face, groin or underarms.  Moisturizer may burn or sting initially. Try for at least 4 weeks.    For scalp Start Fluocinonide solution 1 -2 drops once or twice daily as needed for itching on scalp  Ketoconazole  2% shampoo wash scalp 3 times weekly, let sit 5 minutes and rinse out  Cryotherapy Aftercare  Wash gently with soap and water everyday.   Apply Vaseline and Band-Aid daily until healed.      Recommend mild soap  Due to recent changes in healthcare laws, you may see results of your pathology and/or laboratory studies on MyChart before the doctors have had a chance to review them. We understand that in some cases there may be results that are confusing or concerning to you. Please understand that not all results are received at the same time and often the doctors may need to interpret multiple results in order to provide you with the best plan of care or course of treatment. Therefore, we ask that you please give us  2 business days to thoroughly review all your results before contacting the office for clarification. Should we see a critical lab result, you will be contacted sooner.   If You Need Anything After Your Visit  If you have any questions or concerns for your doctor, please call our main line at [phone] and press option 4 to reach your doctor's medical assistant. If no one answers, please leave a voicemail as directed and we will return  your call as soon as possible. Messages left after 4 pm will be answered the following business day.   You may also send us  a message via MyChart. We typically respond to MyChart messages within 1-2 business days.  For prescription refills, please ask your pharmacy to contact our office. Our fax number is [phone].  If you have an urgent issue when the clinic is closed that cannot wait until the next business day, you can page your doctor at the number below.    Please note that while we do our best to be available for urgent issues outside of office hours, we are not available 24/7.   If you have an urgent issue and are unable to reach us , you may choose to seek medical care at your doctor's office, retail clinic, urgent care center, or emergency room.  If you have a medical emergency, please immediately call 911 or go to the emergency department.  Pager Numbers  - Dr. Bary Likes: [phone]  - Dr. Annette Barters: [phone]  - Dr. Felipe Horton: [phone]   In the event of inclement weather, please call our main line at [phone] for an update on the status of any delays or closures.  Dermatology Medication Tips: Please keep the boxes that topical medications come in in order to help keep track of the instructions about where and how to use these. Pharmacies typically print the medication instructions only on the boxes and not  directly on the medication tubes.   If your medication is too expensive, please contact our office at [phone] option 4 or send us  a message through MyChart.   We are unable to tell what your co-pay for medications will be in advance as this is different depending on your insurance coverage. However, we may be able to find a substitute medication at lower cost or fill out paperwork to get insurance to cover a needed medication.   If a prior authorization is required to get your medication covered by your insurance company, please allow us  1-2 business days to  complete this process.  Drug prices often vary depending on where the prescription is filled and some pharmacies may offer cheaper prices.  The website www.goodrx.com contains coupons for medications through different pharmacies. The prices here do not account for what the cost may be with help from insurance (it may be cheaper with your insurance), but the website can give you the price if you did not use any insurance.  - You can print the associated coupon and take it with your prescription to the pharmacy.  - You may also stop by our office during regular business hours and pick up a GoodRx coupon card.  - If you need your prescription sent electronically to a different pharmacy, notify our office through [facility] or by phone at [phone] option 4.     Si Usted Necesita Algo Despus de Su Visita  Tambin puede enviarnos un mensaje a travs de Clinical cytogeneticist. Por lo general respondemos a los mensajes de MyChart en el transcurso de 1 a 2 das hbiles.  Para renovar recetas, por favor pida a su farmacia que se ponga en contacto con nuestra oficina. Franz Jacks de fax es [place] [phone].  Si tiene un asunto urgente cuando la clnica est cerrada y que no puede esperar hasta el siguiente da hbil, puede llamar/localizar a su doctor(a) al nmero que aparece a continuacin.   Por favor, tenga en cuenta que aunque hacemos todo lo posible para estar disponibles para asuntos urgentes fuera del horario de [place], no estamos disponibles las 24 horas del da, los 7 [address]  [address] de la [place].   Si tiene un problema urgente y no puede comunicarse con nosotros, puede optar por buscar atencin mdica  en el consultorio de su doctor(a), en una clnica privada, en un centro de atencin urgente o en una sala de emergencias.  Si tiene Engineer, drilling, por favor llame inmediatamente al 911 o vaya a la sala de emergencias.  Nmeros de bper  - Dr. Bary Likes: [phone]  - Dra. Annette Barters:  [phone]  - Dr. Felipe Horton: [phone]   En caso de inclemencias del tiempo, por favor llame a Lajuan Pila principal al [phone] para una actualizacin sobre el [place] de cualquier retraso o cierre.  Consejos para la medicacin en dermatologa: Por favor, guarde las cajas en las que vienen los medicamentos de uso tpico para ayudarle a seguir las instrucciones sobre dnde y cmo usarlos. Las farmacias generalmente imprimen las instrucciones del medicamento slo en las cajas y no directamente en los tubos del [place].   Si su medicamento es muy caro, por favor, pngase en contacto con Bettyjane Brunet llamando al [phone] y presione la opcin 4 o envenos un mensaje a travs de Clinical cytogeneticist.   No podemos decirle cul ser su copago por los medicamentos por adelantado ya que esto es diferente dependiendo de la cobertura de su seguro. Sin embargo, es posible que podamos encontrar un  medicamento sustituto a Audiological scientist un formulario para que el seguro cubra el medicamento que se considera necesario.   Si se requiere una autorizacin previa para que su compaa de seguros [place] su medicamento, por favor permtanos de 1 a 2 das hbiles para completar este proceso.  Los precios de los medicamentos varan con frecuencia dependiendo del Environmental consultant de dnde se surte la receta y alguna farmacias pueden ofrecer precios ms baratos.  El sitio web www.goodrx.com tiene cupones para medicamentos de Health and safety inspector. Los precios aqu no tienen en cuenta lo que podra costar con la ayuda del seguro (puede ser ms barato con su seguro), pero el sitio web puede darle el precio si no utiliz Tourist information centre manager.  - Puede imprimir el cupn correspondiente y llevarlo con su receta a la farmacia.  - Tambin puede pasar por nuestra oficina durante el horario de atencin regular y Education officer, museum una tarjeta de cupones de GoodRx.  - Si necesita que su receta se enve electrnicamente a una farmacia diferente, informe  a nuestra oficina a travs de MyChart de [place] o por telfono llamando al [phone] y presione la opcin 4.

## 2024-03-06 ENCOUNTER — Ambulatory Visit: Payer: Medicare Other | Admitting: Dermatology

## 2024-03-06 DIAGNOSIS — L820 Inflamed seborrheic keratosis: Secondary | ICD-10-CM

## 2024-03-06 DIAGNOSIS — D492 Neoplasm of unspecified behavior of bone, soft tissue, and skin: Secondary | ICD-10-CM

## 2024-03-06 DIAGNOSIS — Z79899 Other long term (current) drug therapy: Secondary | ICD-10-CM

## 2024-03-06 DIAGNOSIS — L281 Prurigo nodularis: Secondary | ICD-10-CM

## 2024-03-06 MED ORDER — DUPILUMAB 300 MG/2ML ~~LOC~~ SOSY
600.0000 mg | PREFILLED_SYRINGE | SUBCUTANEOUS | Status: AC
  Administered 2024-03-06 – 2024-03-21 (×2): 600 mg via SUBCUTANEOUS

## 2024-03-06 MED ORDER — DUPIXENT 300 MG/2ML ~~LOC~~ SOAJ
300.0000 mg | SUBCUTANEOUS | 6 refills | Status: DC
Start: 2024-03-06 — End: 2024-07-18

## 2024-03-06 NOTE — Patient Instructions (Addendum)

## 2024-03-06 NOTE — Progress Notes (Signed)
 Follow-Up Visit   Subjective  Diamond Walsh is a 89 y.o. female who presents for the following: ISK 30m f/u face, back, chest.  She continues to itch all the time.  Topical Clobetasol /CeraVe cream only helps for about an hour at a time.  She has a large growth on her abdomen that gets sore and caught on clothing.  Also itches   The following portions of the chart were reviewed this encounter and updated as appropriate: medications, allergies, medical history  Review of Systems:  No other skin or systemic complaints except as noted in HPI or Assessment and Plan.  Objective  Well appearing patient in no apparent distress; mood and affect are within normal limits.  A focused examination was performed of the following areas: Trunk, arms  Relevant exam findings are noted in the Assessment and Plan.  Right Flank 2.1cm waxy stuck on plaque   Assessment & Plan   PRURIGO NODULARIS/LICHEN SIMPLEX CHRONICUS Arms, back Exam: Excoriated papules and nodules at multiple pink excoriated nodules back, arms x 25 nodules; scalp with mild erythema/scale- improved per patient  Chronic and persistent condition with duration or expected duration over one year. Condition is bothersome/symptomatic for patient. Currently flared.   Lichen simplex chronicus [facility]) is a persistent itchy area of thickened skin that is induced by chronic rubbing and/or scratching (chronic dermatitis).  These areas may be pink, hyperpigmented and may have excoriations and bumps (prurigo nodules-PN).  PN/LSC is commonly observed in uncontrolled atopic dermatitis and other forms of eczema, and in other itchy skin conditions (eg, insect bites, scabies).  Sometimes it is not possible to know initial cause of PN/LSC if it has been present for a long time.  It generally responds well to treatment with high potency topical steroids.  It is important to stop rubbing/scratching the area in order to break the itch-scratch-rash-itch cycle, in  order for the rash to resolve.   Treatment Plan: Start Dupixent  300mg /87ml sq injections q 2 wks Loading dose 300mg /18ml x 2 sq injections today to R and L upper arm samples x 2 syringes Lot [id number] 11/2025 Cont Clobetasol Orlie mix prn flares Cont Ketoconazole  2% shampoo 3x/wk to wash scalp, let sit 5 minutes and rinse out Cont Fluocinonide  sol 1-2 gtts to aa scalp qd/bid prn itch Avoid picking/rubbing/scratching  Dupilumab  (Dupixent ) is a treatment given by injection for adults and children with moderate-to-severe atopic dermatitis. Goal is control of skin condition, not cure. It is given as 2 injections at the first dose followed by 1 injection ever 2 weeks thereafter.  Young children are dosed monthly.  Potential side effects include allergic reaction, herpes infections, injection site reactions and conjunctivitis (inflammation of the eyes).  The use of Dupixent  requires long term medication management, including periodic office visits.  Topical steroids (such as triamcinolone , fluocinolone, fluocinonide , mometasone , clobetasol , halobetasol, betamethasone, hydrocortisone) can cause thinning and lightening of the skin if they are used for too long in the same area. Your physician has selected the right strength medicine for your problem and area affected on the body. Please use your medication only as directed by your physician to prevent side effects.   INFLAMED SEBORRHEIC KERATOSIS back - Stuck-on, waxy, papules and/or plaques with erythema and excoriations, and overlying prurigo nodules - Benign-appearing, itchy per patient - Discussed benign etiology and prognosis.  Have tried cryotherapy, but not helping with itch - Observe.  Continue Clobetasol /CeraVe mix every day/bid prn itch.  Will start Dupixent  as above.  And once inflammation  has calmed down, will reevaluate for possible cryotherapy. - Call for any changes  NEOPLASM OF SKIN Right Flank Epidermal / dermal shaving  Lesion  diameter (cm):  2.1 Informed consent: discussed and consent obtained   Timeout: patient name, date of birth, surgical site, and procedure verified   Procedure prep:  Patient was prepped and draped in usual sterile fashion Prep type:  Isopropyl alcohol Anesthesia: the lesion was anesthetized in a standard fashion   Anesthetic:  1% lidocaine w/ epinephrine 1-100,000 buffered w/ 8.4% NaHCO3 Instrument used: DermaBlade   Hemostasis achieved with: pressure, aluminum chloride and electrodesiccation   Outcome: patient tolerated procedure well   Post-procedure details: sterile dressing applied and wound care instructions given   Dressing type: bandage and pressure dressing (mupirocin)    Specimen 1 - Surgical pathology Differential Diagnosis: ISK vs Other  Check Margins: yes 2 cm waxy stuck on plaque PRURIGO NODULARIS   Related Medications dupilumab  (DUPIXENT ) prefilled syringe 600 mg   Return for 2 wks with nurse Dupixent  injection, 4-6 wks f/u with Dr. Jackquline Prurigo Nodules.  I, Grayce Saunas, RMA, am acting as scribe for Rexene Jackquline, MD .   Documentation: I have reviewed the above documentation for accuracy and completeness, and I agree with the above.  Rexene Jackquline, MD

## 2024-03-08 LAB — SURGICAL PATHOLOGY

## 2024-03-13 ENCOUNTER — Ambulatory Visit: Payer: Self-pay | Admitting: Dermatology

## 2024-03-13 NOTE — Telephone Encounter (Signed)
-----   Message from Rexene Rattler sent at 03/13/2024 10:21 AM EDT ----- 1. Skin, right flank :       SEBORRHEIC KERATOSIS, INFLAMED, MARGIN INVOLVED   Benign ISK - please call patient ----- Message ----- From: Interface, Lab In [place] Sent: 03/08/2024   6:22 PM EDT To: Rexene Rattler, MD

## 2024-03-13 NOTE — Telephone Encounter (Signed)
 Advised pt of bx results/sh ?

## 2024-03-20 ENCOUNTER — Ambulatory Visit

## 2024-03-21 ENCOUNTER — Ambulatory Visit (INDEPENDENT_AMBULATORY_CARE_PROVIDER_SITE_OTHER)

## 2024-03-21 DIAGNOSIS — L281 Prurigo nodularis: Secondary | ICD-10-CM | POA: Diagnosis not present

## 2024-03-21 NOTE — Progress Notes (Signed)
 Patient here for Dupixent  injection for Prurigo Nodularis.   Dupixent  syringe 300mg /20mL injected into right upper arm. Patient tolerated injection well.  LOT: [id number] EXP: 12/2025  Alan Pizza, RMA

## 2024-04-11 ENCOUNTER — Ambulatory Visit: Admitting: Dermatology

## 2024-04-11 DIAGNOSIS — Z7189 Other specified counseling: Secondary | ICD-10-CM

## 2024-04-11 DIAGNOSIS — Z79899 Other long term (current) drug therapy: Secondary | ICD-10-CM | POA: Diagnosis not present

## 2024-04-11 DIAGNOSIS — L281 Prurigo nodularis: Secondary | ICD-10-CM | POA: Diagnosis not present

## 2024-04-11 MED ORDER — DUPILUMAB 300 MG/2ML ~~LOC~~ SOAJ
300.0000 mg | SUBCUTANEOUS | Status: AC
  Administered 2024-04-11 – 2024-05-09 (×3): 300 mg via SUBCUTANEOUS

## 2024-04-11 NOTE — Progress Notes (Signed)
 Follow-Up Visit   Subjective  Diamond Walsh is a 89 y.o. female who presents for the following: Prurigo Nodularis/LSC, 1 month follow-up. Patient started loading dose of Dupixent  on 03/06/24. She ran out of Clob/CeraVe cream.  Itching isn't as intense.   The following portions of the chart were reviewed this encounter and updated as appropriate: medications, allergies, medical history  Review of Systems:  No other skin or systemic complaints except as noted in HPI or Assessment and Plan.  Objective  Well appearing patient in no apparent distress; mood and affect are within normal limits.  Areas Examined: Face, arms, legs, trunk  Relevant physical exam findings are noted in the Assessment and Plan.    Assessment & Plan   PRURIGO NODULARIS   Related Medications dupilumab  (DUPIXENT ) prefilled syringe 600 mg  Dupilumab  SOAJ 300 mg  MEDICATION MANAGEMENT   COUNSELING AND COORDINATION OF CARE   LONG-TERM USE OF HIGH-RISK MEDICATION   PRURIGO NODULARIS/LICHEN SIMPLEX CHRONICUS,  Severe, on Dupixent  (biologic medication).  Arms, back Exam: Pink scaly nodules back, arms, legs with thinning. Pt says itching isn't as intense  Chronic and persistent condition with duration or expected duration over one year. Condition is bothersome/symptomatic for patient. Currently flared.     Lichen simplex chronicus [facility]) is a persistent itchy area of thickened skin that is induced by chronic rubbing and/or scratching (chronic dermatitis).  These areas may be pink, hyperpigmented and may have excoriations and bumps (prurigo nodules-PN).  PN/LSC is commonly observed in uncontrolled atopic dermatitis and other forms of eczema, and in other itchy skin conditions (eg, insect bites, scabies).  Sometimes it is not possible to know initial cause of PN/LSC if it has been present for a long time.  It generally responds well to treatment with high potency topical steroids.  It is important to stop  rubbing/scratching the area in order to break the itch-scratch-rash-itch cycle, in order for the rash to resolve.   Treatment Plan:  Cont Clobetasol Orlie mix bid prn itch Cont Ketoconazole  2% shampoo 3x/wk to wash scalp, let sit 5 minutes and rinse out Cont Fluocinonide  sol 1-2 gtts to aa scalp qd/bid prn itch Continue Dupixent  300 mg/2mL SQ QOW. Patient denies side effects. Dupixent  was approved but has very high copay >$1100.  She will need to talk to pharmacy about pt assistance options.  Dupixent  300mg /42mL injected SQ into the left upper arm. Patient tolerated injection well.  NDC 9975-4084-98 Lot [id number] Exp 04/23/2026 Avoid picking/rubbing/scratching Potential side effects include allergic reaction, herpes infections, injection site reactions and conjunctivitis (inflammation of the eyes).  The use of Dupixent  requires long term medication management, including periodic office visits.    Dupilumab  (Dupixent ) is a treatment given by injection for adults and children with moderate-to-severe atopic dermatitis. Goal is control of skin condition, not cure. It is given as 2 injections at the first dose followed by 1 injection ever 2 weeks thereafter.  Young children are dosed monthly.  Topical steroids (such as triamcinolone , fluocinolone, fluocinonide , mometasone , clobetasol , halobetasol, betamethasone, hydrocortisone) can cause thinning and lightening of the skin if they are used for too long in the same area. Your physician has selected the right strength medicine for your problem and area affected on the body. Please use your medication only as directed by your physician to prevent side effects.   Long term medication management.  Patient is using long term (months to years) prescription medication  to control their dermatologic condition.  These medications require periodic monitoring to  evaluate for efficacy and side effects and may require periodic laboratory monitoring.   Recommend  gentle skin care.   Return in about 3 months (around 07/12/2024) for PN with Dr GORMAN. Patient to see nurse every 2 wks until pt able to get Dupixent  through pharmacy.  IAndrea Kerns, CMA, am acting as scribe for Rexene Rattler, MD .   Documentation: I have reviewed the above documentation for accuracy and completeness, and I agree with the above.  Rexene Rattler, MD

## 2024-04-11 NOTE — Patient Instructions (Addendum)
 Dupilumab  (Dupixent ) is a biologic treatment given by injection for adults and children with moderate-to-severe atopic dermatitis. Goal is control of skin condition, not cure. It is given as 2 injections at the first dose followed by 1 injection every 2 weeks thereafter.  Young children are dosed monthly.  Potential side effects include allergic reaction, herpes infections, injection site reactions and conjunctivitis (inflammation of the eyes).  The use of Dupixent  requires long term medication management, including periodic office visits.  Due to recent changes in healthcare laws, you may see results of your pathology and/or laboratory studies on MyChart before the doctors have had a chance to review them. We understand that in some cases there may be results that are confusing or concerning to you. Please understand that not all results are received at the same time and often the doctors may need to interpret multiple results in order to provide you with the best plan of care or course of treatment. Therefore, we ask that you please give us  2 business days to thoroughly review all your results before contacting the office for clarification. Should we see a critical lab result, you will be contacted sooner.   If You Need Anything After Your Visit  If you have any questions or concerns for your doctor, please call our main line at [phone] and press option 4 to reach your doctor's medical assistant. If no one answers, please leave a voicemail as directed and we will return your call as soon as possible. Messages left after 4 pm will be answered the following business day.   You may also send us  a message via MyChart. We typically respond to MyChart messages within 1-2 business days.  For prescription refills, please ask your pharmacy to contact our office. Our fax number is [phone].  If you have an urgent issue when the clinic is closed that cannot wait until the next business day, you can  page your doctor at the number below.    Please note that while we do our best to be available for urgent issues outside of office hours, we are not available 24/7.   If you have an urgent issue and are unable to reach us , you may choose to seek medical care at your doctor's office, retail clinic, urgent care center, or emergency room.  If you have a medical emergency, please immediately call 911 or go to the emergency department.  Pager Numbers  - Dr. Hester: [phone]  - Dr. Jackquline: [phone]  - Dr. Claudene: [phone]   - Dr. Raymund: [phone]  In the event of inclement weather, please call our main line at [phone] for an update on the status of any delays or closures.  Dermatology Medication Tips: Please keep the boxes that topical medications come in in order to help keep track of the instructions about where and how to use these. Pharmacies typically print the medication instructions only on the boxes and not directly on the medication tubes.   If your medication is too expensive, please contact our office at [phone] option 4 or send us  a message through MyChart.   We are unable to tell what your co-pay for medications will be in advance as this is different depending on your insurance coverage. However, we may be able to find a substitute medication at lower cost or fill out paperwork to get insurance to cover a needed medication.   If a prior authorization is required to get your medication covered by your insurance company, please allow  us  1-2 business days to complete this process.  Drug prices often vary depending on where the prescription is filled and some pharmacies may offer cheaper prices.  The website www.goodrx.com contains coupons for medications through different pharmacies. The prices here do not account for what the cost may be with help from insurance (it may be cheaper with your insurance), but the website can give you the price if you did  not use any insurance.  - You can print the associated coupon and take it with your prescription to the pharmacy.  - You may also stop by our office during regular business hours and pick up a GoodRx coupon card.  - If you need your prescription sent electronically to a different pharmacy, notify our office through [facility] or by phone at [phone] option 4.     Si Usted Necesita Algo Despus de Su Visita  Tambin puede enviarnos un mensaje a travs de Clinical cytogeneticist. Por lo general respondemos a los mensajes de MyChart en el transcurso de 1 a 2 das hbiles.  Para renovar recetas, por favor pida a su farmacia que se ponga en contacto con nuestra oficina. Randi lakes de fax es [place] [phone].  Si tiene un asunto urgente cuando la clnica est cerrada y que no puede esperar hasta el siguiente da hbil, puede llamar/localizar a su doctor(a) al nmero que aparece a continuacin.   Por favor, tenga en cuenta que aunque hacemos todo lo posible para estar disponibles para asuntos urgentes fuera del horario de [place], no estamos disponibles las 24 horas del da, los 7 [address]  [address] de la [place].   Si tiene un problema urgente y no puede comunicarse con nosotros, puede optar por buscar atencin mdica  en el consultorio de su doctor(a), en una clnica privada, en un centro de atencin urgente o en una sala de emergencias.  Si tiene Engineer, drilling, por favor llame inmediatamente al 911 o vaya a la sala de emergencias.  Nmeros de bper  - Dr. Hester: [phone]  - Dra. Jackquline: [phone]  - Dr. Claudene: [phone]  - Dra. Kitts: [phone]  En caso de inclemencias del [place], por favor llame a nuestra lnea principal al [phone] para una actualizacin sobre el estado de cualquier retraso o cierre.  Consejos para la medicacin en dermatologa: Por favor, guarde las cajas en las que vienen los medicamentos de uso tpico para ayudarle a seguir las instrucciones sobre  dnde y cmo usarlos. Las farmacias generalmente imprimen las instrucciones del medicamento slo en las cajas y no directamente en los tubos del [place].   Si su medicamento es muy caro, por favor, pngase en contacto con landry rieger llamando al [phone] y presione la opcin 4 o envenos un mensaje a travs de Clinical cytogeneticist.   No podemos decirle cul ser su copago por los medicamentos por adelantado ya que esto es diferente dependiendo de la cobertura de su seguro. Sin embargo, es posible que podamos encontrar un medicamento sustituto a Audiological scientist un formulario para que el seguro cubra el medicamento que se considera necesario.   Si se requiere una autorizacin previa para que su compaa de seguros malta su medicamento, por favor permtanos de 1 a 2 das hbiles para completar este proceso.  Los precios de los medicamentos varan con frecuencia dependiendo del Environmental consultant de dnde se surte la receta y alguna farmacias pueden ofrecer precios ms baratos.  El sitio web www.goodrx.com tiene cupones para medicamentos de Health and safety inspector. Los precios aqu no tienen  en cuenta lo que podra costar con la ayuda del seguro (puede ser ms barato con su seguro), pero el sitio web puede darle el precio si no Visual merchandiser.  - Puede imprimir el cupn correspondiente y llevarlo con su receta a la farmacia.  - Tambin puede pasar por nuestra oficina durante el horario de atencin regular y Education officer, museum una tarjeta de cupones de GoodRx.  - Si necesita que su receta se enve electrnicamente a una farmacia diferente, informe a nuestra oficina a travs de MyChart de [place] o por telfono llamando al [phone] y presione la opcin 4.

## 2024-04-25 ENCOUNTER — Ambulatory Visit (INDEPENDENT_AMBULATORY_CARE_PROVIDER_SITE_OTHER)

## 2024-04-25 DIAGNOSIS — L281 Prurigo nodularis: Secondary | ICD-10-CM | POA: Diagnosis not present

## 2024-04-25 NOTE — Progress Notes (Signed)
 Patient here for Dupixent  injection for Prurigo Nodularis.    Dupixent  syringe 300mg /77mL injected into right upper arm. Patient tolerated injection well.   LOT: [id number] EXP: 08/23/2025  Lonell Drones, RMA

## 2024-05-08 ENCOUNTER — Other Ambulatory Visit: Payer: Self-pay | Admitting: Family Medicine

## 2024-05-08 DIAGNOSIS — Z1231 Encounter for screening mammogram for malignant neoplasm of breast: Secondary | ICD-10-CM

## 2024-05-09 ENCOUNTER — Ambulatory Visit (INDEPENDENT_AMBULATORY_CARE_PROVIDER_SITE_OTHER)

## 2024-05-09 DIAGNOSIS — L281 Prurigo nodularis: Secondary | ICD-10-CM | POA: Diagnosis not present

## 2024-05-09 NOTE — Progress Notes (Signed)
 Patient here for Dupixent  injection for Prurigo Nodularis.    Dupixent  syringe 300mg /87mL injected into left upper arm. Patient tolerated injection well.   LOT: [id number] EXP: 08/23/2025   Alan Pizza, RMA

## 2024-05-23 ENCOUNTER — Ambulatory Visit

## 2024-05-31 ENCOUNTER — Ambulatory Visit
Admission: RE | Admit: 2024-05-31 | Discharge: 2024-05-31 | Disposition: A | Discharge status: Home / Self Care | Source: Ambulatory Visit | Attending: Family Medicine | Admitting: Family Medicine

## 2024-05-31 DIAGNOSIS — Z1231 Encounter for screening mammogram for malignant neoplasm of breast: Secondary | ICD-10-CM | POA: Diagnosis present

## 2024-07-18 ENCOUNTER — Encounter: Payer: Self-pay | Admitting: Dermatology

## 2024-07-18 ENCOUNTER — Ambulatory Visit: Admitting: Dermatology

## 2024-07-18 DIAGNOSIS — L280 Lichen simplex chronicus: Secondary | ICD-10-CM | POA: Diagnosis not present

## 2024-07-18 DIAGNOSIS — Z79899 Other long term (current) drug therapy: Secondary | ICD-10-CM | POA: Diagnosis not present

## 2024-07-18 DIAGNOSIS — L820 Inflamed seborrheic keratosis: Secondary | ICD-10-CM

## 2024-07-18 DIAGNOSIS — Z7189 Other specified counseling: Secondary | ICD-10-CM

## 2024-07-18 DIAGNOSIS — L281 Prurigo nodularis: Secondary | ICD-10-CM

## 2024-07-18 MED ORDER — DUPIXENT 300 MG/2ML ~~LOC~~ SOAJ: 300.0000 mg | SUBCUTANEOUS | 6 refills | Status: AC

## 2024-07-18 MED ORDER — CLOBETASOL PROPIONATE 0.05 % EX SOLN: 1.0000 | CUTANEOUS | 1 refills | Status: AC

## 2024-07-18 NOTE — Progress Notes (Signed)
 Follow-Up Visit   Subjective  Diamond Walsh is a 89 y.o. female who presents for the following: Prurigo Nodularis/LSC, 8m f/u, arms, back, Clobetasol /cerave mix qd, Ketoconazole  2% shampoo 3x/wk, Fluocinonide  sol every other day, Dupixent  sq injections. Still has a few itchy spots on back and arm.  Overall much improved with less itching.   The following portions of the chart were reviewed this encounter and updated as appropriate: medications, allergies, medical history  Review of Systems:  No other skin or systemic complaints except as noted in HPI or Assessment and Plan.  Objective  Well appearing patient in no apparent distress; mood and affect are within normal limits.   A focused examination was performed of the following areas: Arms, back, scalp  Relevant exam findings are noted in the Assessment and Plan.  L mid back x 5, R post upper arm x 4, R ant shoulder x 1 (10) Stuck on waxy papules with erythema  Assessment & Plan   PRURIGO NODULARIS/LICHEN SIMPLEX CHRONICUS Started Dupixent  03/06/24 Arms, back, scalp, lower legs Exam: arms, back, lower legs clear  Chronic condition with duration or expected duration over one year. Currently well-controlled.   Lichen simplex chronicus [facility]) is a persistent itchy area of thickened skin that is induced by chronic rubbing and/or scratching (chronic dermatitis).  These areas may be pink, hyperpigmented and may have excoriations and bumps (prurigo nodules-PN).  PN/LSC is commonly observed in uncontrolled atopic dermatitis and other forms of eczema, and in other itchy skin conditions (eg, insect bites, scabies).  Sometimes it is not possible to know initial cause of PN/LSC if it has been present for a long time.  It generally responds well to treatment with high potency topical steroids.  It is important to stop rubbing/scratching the area in order to break the itch-scratch-rash-itch cycle, in order for the rash to resolve.   Treatment  Plan: Cont Dupixent  300mg /7ml sq injections q 2 wks, discussed if pt feels she is improving she can decrease injections to q 3 weeks and longer if improving and not flaring in between injections Cont Clobetasol Orlie mix qd prn flares, clobetasol  sol rf sent Cont Fluocinonide  sol every other day to aa scalp prn flares Cont Ketoconazole  2% shampoo 3x/wk, let sit 5 minutes and rinse out  Avoid picking/rubbing/scratching  Dupilumab  (Dupixent ) is a biologic treatment given by injection for adults and children with moderate-to-severe atopic dermatitis. Goal is control of skin condition, not cure. It is given as 2 injections at the first dose followed by 1 injection every 2 weeks thereafter.  Young children are dosed monthly.  Potential side effects include allergic reaction, herpes infections, injection site reactions and conjunctivitis (inflammation of the eyes).  The use of Dupixent  requires long term medication management, including periodic office visits.   Topical steroids (such as triamcinolone , fluocinolone, fluocinonide , mometasone , clobetasol , halobetasol, betamethasone, hydrocortisone) can cause thinning and lightening of the skin if they are used for too long in the same area. Your physician has selected the right strength medicine for your problem and area affected on the body. Please use your medication only as directed by your physician to prevent side effects.    INFLAMED SEBORRHEIC KERATOSIS (10) L mid back x 5, R post upper arm x 4, R ant shoulder x 1 (10) Symptomatic, irritating, patient would like treated.  If R ant shoulder does not clear with LN2 pt advised to call clinic for recheck Destruction of lesion - L mid back x 5, R post upper arm  x 4, R ant shoulder x 1 (10)  Destruction method: cryotherapy   Informed consent: discussed and consent obtained   Lesion destroyed using liquid nitrogen: Yes   Region frozen until ice ball extended beyond lesion: Yes   Outcome: patient  tolerated procedure well with no complications   Post-procedure details: wound care instructions given   Additional details:  Prior to procedure, discussed risks of blister formation, small wound, skin dyspigmentation, or rare scar following cryotherapy. Recommend Vaseline ointment to treated areas while healing.    Return in about 6 months (around 01/15/2025) for Prurig Nodularis/LSC f/u.  I, Grayce Saunas, RMA, am acting as scribe for Rexene Rattler, MD .   Documentation: I have reviewed the above documentation for accuracy and completeness, and I agree with the above.  Rexene Rattler, MD

## 2024-07-18 NOTE — Patient Instructions (Addendum)

## 2025-02-06 ENCOUNTER — Ambulatory Visit: Admitting: Dermatology
